# Patient Record
Sex: Female | Born: 1970 | Race: White | Hispanic: No | Marital: Single | Smoking: Current every day smoker
Health system: Southern US, Community
[De-identification: ages and names within clinical notes are randomized; demographics above are authoritative.]

## PROBLEM LIST (undated history)

## (undated) DIAGNOSIS — M25562 Pain in left knee: Secondary | ICD-10-CM

## (undated) DIAGNOSIS — G8929 Other chronic pain: Secondary | ICD-10-CM

## (undated) DIAGNOSIS — M5432 Sciatica, left side: Secondary | ICD-10-CM

## (undated) DIAGNOSIS — M9979 Connective tissue and disc stenosis of intervertebral foramina of abdomen and other regions: Secondary | ICD-10-CM

## (undated) DIAGNOSIS — M549 Dorsalgia, unspecified: Secondary | ICD-10-CM

## (undated) DIAGNOSIS — M25561 Pain in right knee: Secondary | ICD-10-CM

## (undated) DIAGNOSIS — C801 Malignant (primary) neoplasm, unspecified: Secondary | ICD-10-CM

## (undated) HISTORY — PX: PELVIC EXENTERATION: SHX738

## (undated) HISTORY — PX: BIOPSY BREAST: PRO8

## (undated) HISTORY — PX: ABDOMINAL HYSTERECTOMY: SHX81

---

## 1999-08-09 ENCOUNTER — Encounter: Admission: RE | Admit: 1999-08-09 | Discharge: 1999-09-16 | Payer: Self-pay | Admitting: Preventative Medicine

## 1999-09-29 ENCOUNTER — Ambulatory Visit (HOSPITAL_COMMUNITY): Admission: RE | Admit: 1999-09-29 | Discharge: 1999-09-29 | Payer: Self-pay | Admitting: Gastroenterology

## 2003-12-24 ENCOUNTER — Emergency Department (HOSPITAL_COMMUNITY): Admission: EM | Admit: 2003-12-24 | Discharge: 2003-12-24 | Payer: Self-pay | Admitting: Emergency Medicine

## 2003-12-26 ENCOUNTER — Other Ambulatory Visit: Admission: RE | Admit: 2003-12-26 | Discharge: 2003-12-26 | Payer: Self-pay | Admitting: Obstetrics and Gynecology

## 2004-03-12 ENCOUNTER — Encounter (INDEPENDENT_AMBULATORY_CARE_PROVIDER_SITE_OTHER): Payer: Self-pay | Admitting: Specialist

## 2004-03-13 ENCOUNTER — Inpatient Hospital Stay (HOSPITAL_COMMUNITY): Admission: AD | Admit: 2004-03-13 | Discharge: 2004-03-14 | Payer: Self-pay | Admitting: Obstetrics and Gynecology

## 2004-10-19 ENCOUNTER — Other Ambulatory Visit: Admission: RE | Admit: 2004-10-19 | Discharge: 2004-10-19 | Payer: Self-pay | Admitting: Gynecology

## 2004-11-19 ENCOUNTER — Encounter (INDEPENDENT_AMBULATORY_CARE_PROVIDER_SITE_OTHER): Payer: Self-pay | Admitting: *Deleted

## 2004-11-19 ENCOUNTER — Observation Stay (HOSPITAL_COMMUNITY): Admission: RE | Admit: 2004-11-19 | Discharge: 2004-11-20 | Payer: Self-pay | Admitting: Gynecology

## 2005-06-23 ENCOUNTER — Emergency Department: Payer: Self-pay | Admitting: Emergency Medicine

## 2006-07-04 ENCOUNTER — Emergency Department: Payer: Self-pay | Admitting: Emergency Medicine

## 2007-12-01 ENCOUNTER — Emergency Department (HOSPITAL_COMMUNITY): Admission: EM | Admit: 2007-12-01 | Discharge: 2007-12-01 | Payer: Self-pay | Admitting: Emergency Medicine

## 2008-05-29 ENCOUNTER — Emergency Department (HOSPITAL_COMMUNITY): Admission: EM | Admit: 2008-05-29 | Discharge: 2008-05-29 | Payer: Self-pay | Admitting: Emergency Medicine

## 2008-06-24 ENCOUNTER — Emergency Department (HOSPITAL_COMMUNITY): Admission: EM | Admit: 2008-06-24 | Discharge: 2008-06-24 | Payer: Self-pay | Admitting: Emergency Medicine

## 2008-07-12 ENCOUNTER — Emergency Department (HOSPITAL_COMMUNITY): Admission: EM | Admit: 2008-07-12 | Discharge: 2008-07-12 | Payer: Self-pay | Admitting: Emergency Medicine

## 2008-08-08 ENCOUNTER — Emergency Department (HOSPITAL_COMMUNITY): Admission: EM | Admit: 2008-08-08 | Discharge: 2008-08-08 | Payer: Self-pay | Admitting: Emergency Medicine

## 2008-08-11 ENCOUNTER — Emergency Department (HOSPITAL_COMMUNITY): Admission: EM | Admit: 2008-08-11 | Discharge: 2008-08-12 | Payer: Self-pay | Admitting: Emergency Medicine

## 2008-08-18 ENCOUNTER — Emergency Department: Payer: Self-pay | Admitting: Unknown Physician Specialty

## 2008-11-08 ENCOUNTER — Emergency Department (HOSPITAL_COMMUNITY): Admission: EM | Admit: 2008-11-08 | Discharge: 2008-11-08 | Payer: Self-pay | Admitting: Emergency Medicine

## 2009-01-17 ENCOUNTER — Emergency Department (HOSPITAL_COMMUNITY): Admission: EM | Admit: 2009-01-17 | Discharge: 2009-01-17 | Payer: Self-pay | Admitting: Emergency Medicine

## 2009-04-27 ENCOUNTER — Emergency Department (HOSPITAL_COMMUNITY): Admission: EM | Admit: 2009-04-27 | Discharge: 2009-04-28 | Payer: Self-pay | Admitting: Emergency Medicine

## 2009-09-07 ENCOUNTER — Emergency Department (HOSPITAL_COMMUNITY): Admission: EM | Admit: 2009-09-07 | Discharge: 2009-09-07 | Payer: Self-pay | Admitting: Emergency Medicine

## 2009-09-10 ENCOUNTER — Emergency Department (HOSPITAL_COMMUNITY): Admission: EM | Admit: 2009-09-10 | Discharge: 2009-09-10 | Payer: Self-pay | Admitting: Emergency Medicine

## 2009-09-22 ENCOUNTER — Emergency Department (HOSPITAL_COMMUNITY): Admission: EM | Admit: 2009-09-22 | Discharge: 2009-09-22 | Payer: Self-pay | Admitting: Emergency Medicine

## 2009-10-05 ENCOUNTER — Emergency Department (HOSPITAL_COMMUNITY): Admission: EM | Admit: 2009-10-05 | Discharge: 2009-10-05 | Payer: Self-pay | Admitting: Emergency Medicine

## 2009-10-06 ENCOUNTER — Emergency Department (HOSPITAL_COMMUNITY): Admission: EM | Admit: 2009-10-06 | Discharge: 2009-10-06 | Payer: Self-pay | Admitting: Emergency Medicine

## 2009-10-12 ENCOUNTER — Ambulatory Visit (HOSPITAL_COMMUNITY): Admission: RE | Admit: 2009-10-12 | Discharge: 2009-10-12 | Payer: Self-pay | Admitting: Emergency Medicine

## 2010-04-26 ENCOUNTER — Emergency Department (HOSPITAL_COMMUNITY): Admission: EM | Admit: 2010-04-26 | Discharge: 2010-04-27 | Payer: Self-pay | Admitting: Emergency Medicine

## 2011-01-16 LAB — CBC
HCT: 41.8 % (ref 36.0–46.0)
MCH: 32.4 pg (ref 26.0–34.0)
MCHC: 34.7 g/dL (ref 30.0–36.0)
Platelets: 279 10*3/uL (ref 150–400)
RBC: 4.47 MIL/uL (ref 3.87–5.11)
WBC: 8.9 10*3/uL (ref 4.0–10.5)

## 2011-01-16 LAB — BASIC METABOLIC PANEL
CO2: 30 mEq/L (ref 19–32)
Calcium: 10 mg/dL (ref 8.4–10.5)
Creatinine, Ser: 0.74 mg/dL (ref 0.4–1.2)
GFR calc Af Amer: >60 mL/min (ref >60)
Potassium: 4.4 mEq/L (ref 3.5–5.1)

## 2011-01-16 LAB — HEPATIC FUNCTION PANEL
ALT: 15 U/L (ref 0–35)
Bilirubin, Direct: <0.1 mg/dL (ref 0.0–0.3)

## 2011-01-16 LAB — URINALYSIS, ROUTINE W REFLEX MICROSCOPIC
Bilirubin Urine: NEGATIVE
Glucose, UA: NEGATIVE mg/dL
Hgb urine dipstick: NEGATIVE
Protein, ur: NEGATIVE mg/dL
Specific Gravity, Urine: 1.019 (ref 1.005–1.030)
Urobilinogen, UA: 0.2 mg/dL (ref 0.0–1.0)
pH: 5.5 (ref 5.0–8.0)

## 2011-01-16 LAB — DIFFERENTIAL
Basophils Absolute: 0.1 10*3/uL (ref 0.0–0.1)
Eosinophils Absolute: 0.3 10*3/uL (ref 0.0–0.7)
Eosinophils Relative: 3 % (ref 0–5)
Lymphs Abs: 3 10*3/uL (ref 0.7–4.0)
Neutrophils Relative %: 56 % (ref 43–77)

## 2011-02-07 LAB — URINALYSIS, ROUTINE W REFLEX MICROSCOPIC
Ketones, ur: NEGATIVE mg/dL
Nitrite: NEGATIVE
Protein, ur: NEGATIVE mg/dL
Specific Gravity, Urine: 1.009 (ref 1.005–1.030)
Urobilinogen, UA: 0.2 mg/dL (ref 0.0–1.0)

## 2011-02-07 LAB — URINE MICROSCOPIC-ADD ON

## 2011-02-07 LAB — URINE CULTURE

## 2011-02-14 LAB — DIFFERENTIAL
Eosinophils Absolute: 0 10*3/uL (ref 0.0–0.7)
Eosinophils Relative: 1 % (ref 0–5)
Lymphs Abs: 2.1 10*3/uL (ref 0.7–4.0)
Monocytes Absolute: 0.1 10*3/uL (ref 0.1–1.0)
Neutro Abs: 2 10*3/uL (ref 1.7–7.7)

## 2011-02-14 LAB — POCT I-STAT, CHEM 8
BUN: 16 mg/dL (ref 6–23)
Calcium, Ion: 1.18 mmol/L (ref 1.12–1.32)
Creatinine, Ser: 0.8 mg/dL (ref 0.4–1.2)
Potassium: 5.2 mEq/L — ABNORMAL HIGH (ref 3.5–5.1)
Sodium: 140 mEq/L (ref 135–145)

## 2011-02-14 LAB — URINALYSIS, ROUTINE W REFLEX MICROSCOPIC: Glucose, UA: NEGATIVE mg/dL

## 2011-02-14 LAB — CBC
MCHC: 33.3 g/dL (ref 30.0–36.0)
RDW: 13.2 % (ref 11.5–15.5)

## 2011-03-18 NOTE — Op Note (Signed)
[name], [name]                          ACCOUNT NO.:  [id number]   MEDICAL RECORD NO.:  [id number]                   PATIENT TYPE:  OBV   LOCATION:  9313                                 FACILITY:  [facility]   PHYSICIAN:  James A. Ashley Royalty, M.D.             DATE OF BIRTH:  [birth year]   DATE OF PROCEDURE:  03/12/2004  DATE OF DISCHARGE:                                 OPERATIVE REPORT   PREOPERATIVE DIAGNOSES:  1. Abnormal uterine bleeding--rule out polyp, fibroid.  2. Pelvic pain--debilitating.   POSTOPERATIVE DIAGNOSES:  1. Abnormal uterine bleeding--rule out polyp, fibroid.  No evidence of     intrauterine polyp or fibroid.  2. Right ovarian cyst. No evidence of endometriosis.   PROCEDURE:  1. Diagnostic/operative laparoscopy.  2. Right oophorectomy.  3. Partial right salpingectomy (distal).  4. Diagnostic hysteroscopy, dilatation and curettage.   SURGEON:  Rudy Jew. Ashley Royalty, M.D.   ANESTHESIA:  General endotracheal.   ESTIMATED BLOOD LOSS:  100 mL.   COMPLICATIONS:  None.   PACKS AND DRAINS:  None.   DESCRIPTION OF PROCEDURE:  The patient was taken to the operating room,  placed in the dorsal supine position where after adequate general  endotracheal anesthesia was administered she was placed in the lithotomy  position and prepped and draped in the usual manner for abdominal and  vaginal therapy. A posterior weighted retractor was placed per vagina.  The  anterior lip of the cervix was grasped with a single tooth tenaculum. The  uterus was gently sounded to approximately 8 cm and noted to be anteverted.  The cervix was then dilated to a size 29 using Pratt dilators. The  resectoscope was then placed using sorbitol as its distention medium. The  thing over the cervix made it difficult to place the resectoscope entirely  in the uterine cavity with only a 29 French dilatation.  His dilatation was  increased to approximately 21 [place] with favorable results thereafter.  The  resectoscope was successfully placed using sorbitol as a distention medium.  There were some bubbles noted in the uterine cavity which prevented the  bilateral. Identification of the tubal ostia.  However, there were no  obvious structural abnormalities within the uterine cavity such as polyps or  fibroids.  The endocervix was similarly benign.  Appropriate photos were  obtained. At this point, the hysteroscopic portion of the procedure was  terminated.   Attention was then turned to the curettage.  A medium size curette was  introduced into the uterine cavity. First a four quadrant technique was  employed and inspected with curettage. Then the therapeutic technique was  used. A moderate to large amount of curettings were obtained for histologic  studies.  Hemostasis was noted.  Next, the Charcot uterine manipulator was  placed per cervix and held in place with the tenaculum. The bladder was  drained again with a red rubber catheter. Attention was then turned to  the  laparoscopy.   A 1.2 cm infraumbilical incision was made in the transverse plane in keeping  with the patient's old laparoscopy incision.  A size 10/11 disposable  laparoscopic trocar was then placed in the abdominal cavity.  Its location  was verified by placement of the laparoscope. There was no evidence of any  trauma. The abdominal cavity was then distended with CO2 and created a good  pneumoperitoneum. This was maintained throughout the procedure. Next, 5 mm  suprapubic trocars were placed in the left and right lower quadrants  respectively. Transillumination and direct visualization techniques were  employed.  The pelvis was then thoroughly surveyed. The uterus was normal  size, shape and contour without evidence of any fibroids or endometriosis.  The left ovary was normal size, shape and contour. It was slightly adherent  to the left pelvic sidewall posteriorly.  However sufficient manipulation  revealed that  the anatomy of the ovary itself was perfectly normal. The left  fallopian tube had been divided in keep with the patient's history of the  tubal sterilization procedure.  There were no other abnormalities of the  left fallopian tube.  The right fallopian tube was similarly divided in  keeping with the patient's history of a tubal sterilization procedure. The  right ovary was enlarged with an approximately 2 cm cyst present on it. It  was also adherent to the right pelvic sidewall.  The anterior and posterior  cul-de-sacs were clean. The remainder of the peritoneal surfaces were smooth  and glistening. There was no evidence of any endometriosis or other pelvic  adhesions. Appropriate photos were obtained.   Due to the patient's prominent right lower quadrant discomfort and because  of the presence of the ovary and cyst, a decision was made to proceed with  right oophorectomy. The ureter was identified well below the plane of  dissection. The right infundibulopelvic ligament was grasped with a tripolar  cautery and cauterized. It was then divided.  The mesovarian was then  cauterized and cut with the tripolar cautery.  The mesosalpinx of the distal  fallopian tube segment was also cauterized in the distal portion of  fallopian tube and ovary were removed in their entirety and submitted to the  pathologist for histologic studies.  The operator encountered a moderate  amount of bleeding from the right mesosalpinx which was controlled with the  tripolar cautery near but not involving the right pelvic sidewall.  The  right ureter was again noted to be well below the plane of the dissection.  Copious irrigation was accomplished. Hemostasis was noted.  Next, the left  inferior trocar site was converted to a 10 mm diameter. The EndoCatch was  placed through this port and the surgical specimen consisting of the right  ovary and distal segment of the right fallopian tube was brought into the bag and  removed through the left inferior incision successfully. It was  submitted to pathology for histologic studies.   At this point, the entire surgical field was thoroughly inspected. The area  of the right adnexectomy was thoroughly inspected as well. The  intraabdominal pressure was intentional diminished and hemostasis continued  to be good.  Copious irrigation was accomplished.  Hemostasis remained good  throughout.   At this point, the patient was felt to have benefitted maximally from the  surgical procedure. The abdominal instruments were removed and  pneumoperitoneum evacuated. The fascial defects were closed with #0 Vicryl  in an interrupted fashion. The skin was closed  with 3-0 Monocryl in a  subcuticular fashion.  The vaginal instruments were removed. A small bleeder  in the left tenaculum site on the cervix was oozing and easily controlled  with 2-0 chromic in an interrupted fashion. Hemostasis was noted and the  procedure terminated.   The patient tolerated the procedure extremely well and was returned to the  recovery room. Due to the slightly increased amount of bleeding in the right  mesosalpinx with the excision of the right ovary and portion of fallopian  tube, the decision was made to observe the patient overnight using a 23 hour  observation format and discharge her home in the morning after CBC  determination if all is stable.                                               James A. Ashley Royalty, M.D.    JAM/MEDQ  D:  03/12/2004  T:  03/13/2004  Job:  [id number]

## 2011-03-18 NOTE — Discharge Summary (Signed)
[name], [name]                          ACCOUNT NO.:  [id number]   MEDICAL RECORD NO.:  [id number]                   PATIENT TYPE:  INP   LOCATION:  9313                                 [facility]ACILITY[facility]   PHYSICIAN:  James A. Ashley Royalty, M.D.             DATE OF BIRTH:  02-Mar-1971   DATE OF ADMISSION:  03/12/2004  DATE OF DISCHARGE:  03/14/2004                                 DISCHARGE SUMMARY   DISCHARGE DIAGNOSES:  1. Hemorrhagic corpus luteum cyst of the right ovary.  2. Benign fallopian tube.  3. Benign secretory endometrium without evidence of hyperplasia.   OPERATIONS AND SPECIAL PROCEDURES:  1. Diagnostic hysteroscopy.  2. Dilatation and curettage.  3. Diagnostic/operative laparoscopy.  4. Right oophorectomy.  5. Partial right salpingectomy (distal).   CONSULTATIONS:  None.   DISCHARGE MEDICATIONS:  Chromagen, Percocet.   HISTORY AND PHYSICAL:  This is a 40 year old gravida 3 para 3 with multiple  concerns including pelvic pain and abnormal uterine bleeding.  For details  please see History and Physical.  She was brought in for  diagnostic/operative hysteroscopy, D&C, and diagnostic/operative  laparoscopy.  For the remainder of the history and physical please see  chart.   HOSPITAL COURSE:  The patient was brought in for outpatient surgery on Mar 12, 2004.  She underwent diagnostic hysteroscopy, dilatation and curettage,  diagnostic/operative laparoscopy, with right oophorectomy and partial right  salpingectomy (distal).  The patient was kept postoperatively for further  evaluation and therapy.  On Mar 14, 2004 she was felt stable for discharge  and was discharged to home per Dr. Lily Peer afebrile and in satisfactory  condition.   ACCESSORY CLINICAL FINDINGS:  Hemoglobin and hematocrit on admission were  9.4 and 30.2 respectively.  Repeat values obtained Mar 13, 2004 were 8.7 and  27.4 respectively.   DISPOSITION:  The patient is to return to [facility]  in approximately 2 weeks for further evaluation and therapy.                                               James A. Ashley Royalty, M.D.    JAM/MEDQ  D:  03/31/2004  T:  04/01/2004  Job:  [id number]

## 2011-03-18 NOTE — Discharge Summary (Signed)
[name], [name]              ACCOUNT NO.:  [id number]   MEDICAL RECORD NO.:  [id number]          PATIENT TYPE:  INP   LOCATION:  9312                          [facility]   PHYSICIAN:  Juan H. Lily Peer, M.D.DATE OF BIRTH:  1971/03/17   DATE OF ADMISSION:  11/19/2004  DATE OF DISCHARGE:  11/20/2004                                 DISCHARGE SUMMARY   HISTORY:  The patient is a 40 year old, gravida 4, para 3, AB 1, with prior  tubal sterilization procedure, was taken to the operating room, on November 20, 2003, secondary to chronic pelvic pain, dysmenorrhea, menorrhagia, and  iron deficiency anemia.  She underwent a laparoscopic assisted vaginal  hysterectomy, had a blood loss of 225 cc, IV fluids 2300 cc.  The patient's  preoperative hemoglobin had been 10.7, postoperative hemoglobin was 8,  platelet count 307,000.  The patient received Mefoxin 1 gram IV  prophylactically before her surgery.  The patient postoperatively was  gradually ambulating and had an advance in her diet from a clear liquid to a  full regular diet.  She had ambulated throughout the hospital several times  the day of her surgery and gone downstairs to smoke on several occasions.  She had passed gas.  Her abdomen was somewhat tender but no rebound or  guarding, good bowel sounds, and she had passed flatus earlier in the day.  The patient will be discharged home and follow up in the office in two  weeks.   FINAL DIAGNOSES:  1.  Chronic pelvic pain.  2.  Dysmenorrhea.  3.  Menorrhagia.  4.  Iron deficiency anemia.   PROCEDURES PERFORMED:  Laparoscopic assisted vaginal hysterectomy.   FINAL DISPOSITION AND FOLLOW UP:  1.  The patient was discharged home a day and a half after her surgery.  2.  She will be discharged with:      1.  Darvocet-N 100 to take 1-2 p.o. q.3-4h. p.r.n.      2.  Motrin 800 mg t.i.d.  3.  The patient was instructed to ambulate.  4.  No lifting, no strenuous activity.  5.  Shower  bath, no tub bath.  6.  The patient should continue to take her multivitamin with iron, since      she can not take ferrous sulfate by itself, in an effort to get her iron      level up.  7.  She remained afebrile and was normotensive, and was ready to be      discharged.  8.  Instructions were provided and she had a Dulcolax suppository before she      left the hospital, and she was instructed to take Metamucil 1 tablespoon      with her juice every morning and in the evening a Colace stool softener      and to keep up with her fluids.  Her husband was present during the      discharge instructions.  All questions were answered.  Will follow      accordingly.    Juan  JHF/MEDQ  D:  11/20/2004  T:  11/20/2004  Job:  [id number]

## 2011-03-18 NOTE — H&P (Signed)
[name], [name]                          ACCOUNT NO.:  [id number]   MEDICAL RECORD NO.:  [id number]                   PATIENT TYPE:  AMB   LOCATION:  SDC                                  [facility]ACILITY:  [facility]   PHYSICIAN:  James A. Ashley Royalty, M.D.             DATE OF BIRTH:  22-Dec-1970   DATE OF ADMISSION:  03/12/2004  DATE OF DISCHARGE:                                HISTORY & PHYSICAL   This is a 40 year old gravida 3, para 3, who presented to me December 26, 2003, with multiple concerns including pelvic pain and abnormal uterine  bleeding.  She described the pelvic pain as equal bilaterally, including  radiation to the hips and legs.  She described the abnormal bleeding as  occurring every two weeks, lasting six days at a time.  She has been seeing  a physician in [place] for an extended period of time and has undergone  conization of the cervix, diagnostic laparoscopy in 1994 at which time she  states endometriosis was found.  She has also had a bilateral tubal  sterilization procedure.  She came for sonohysterogram January 20, 2004, at  which time the ultrasound was normal and there were no intracavitary lesions  noted at sonohysterogram.  She is for diagnostic/operative laparoscopy as  well as diagnostic/operative hysteroscopy and dilatation and curettage.   MEDICATIONS:  None.   PAST MEDICAL HISTORY:  Medical:  Asthma.   Surgical:  As above, plus removal of breast lump.   ALLERGIES:  CODEINE.   FAMILY HISTORY:  Positive for hypertension, diabetes, and breast cancer.   SOCIAL HISTORY:  The patient smokes approximately one-half packs of  cigarettes per day.  She uses alcohol rarely.   REVIEW OF SYSTEMS:  Noncontributory.   PHYSICAL EXAMINATION:  GENERAL:  A well-developed, well-nourished, pleasant  white female in no acute distress.  VITAL SIGNS:  Afebrile, vital signs stable.  SKIN:  Warm and dry without lesions.  LYMPHATIC:  There is no supraclavicular, cervical, or  inguinal adenopathy.  HEENT:  Normocephalic.  NECK:  Supple without thyromegaly.  CHEST:  Lungs are clear.  CARDIAC:  Regular rate and rhythm without murmurs, gallops, or rubs.  BREASTS:  Exam deferred.  ABDOMEN:  Soft, nontender, without masses or organomegaly.  Bowel sounds are  active.  MUSCULOSKELETAL:  Full range of motion without edema, cyanosis, or CVA  tenderness.  PELVIC (Mar 05, 2004):  External genitalia within normal limits.  Vagina and  cervix were without gross lesions.  Bimanual examination reveals the uterus  to be approximately 8 x 4 x 4 cm, and no adnexal masses are palpable.   IMPRESSION:  1. Pelvic pain, debilitating.  Differential includes endometriosis,     adhesions, primary gastrointestinal, musculoskeletal.  2. Abnormal uterine bleeding, etiology uncertain.   PLAN:  1. Diagnostic/operative laparoscopy.  2. Diagnostic/operative hysteroscopy.  3. Dilatation and curettage.   The risks, benefits, complications, and alternatives were fully  discussed  with the patient.  The possibility of unilateral salpingo-oophorectomy  discussed and accepted.  The possibility of exploratory laparotomy discussed  and accepted.  Questions invited and answered.                                               James A. Ashley Royalty, M.D.    JAM/MEDQ  D:  03/12/2004  T:  03/12/2004  Job:  [id number]

## 2011-03-18 NOTE — Op Note (Signed)
NAMEJAKAYLEE, SASAKI              ACCOUNT NO.:  [id number]   MEDICAL RECORD NO.:  [id number]          PATIENT TYPE:  INP   LOCATION:  9312                          [facility]   PHYSICIAN:  Juan H. Lily Peer, M.D.DATE OF BIRTH:  1971/02/13   DATE OF PROCEDURE:  11/19/2004  DATE OF DISCHARGE:                                 OPERATIVE REPORT   PREOPERATIVE DIAGNOSES:  1.  Chronic pelvic pain.  2.  Dysmenorrhea.  3.  Menorrhagia.  4.  Iron-deficiency anemia.   POSTOPERATIVE DIAGNOSES:  1.  Chronic pelvic pain.  2.  Dysmenorrhea.  3.  Menorrhagia.  4.  Iron-deficiency anemia.   ANESTHESIA:  General endotracheal anesthesia.   PROCEDURE PERFORMED:  Laparoscopically-assisted vaginal hysterectomy.   INDICATION FOR OPERATION:  A 40 year old gravida 4, para 3, AB 1, with  chronic pelvic pain, dysmenorrhea, menorrhagia, and iron-deficiency anemia.  Patient with previous laparoscopy for chronic pelvic pain and tubal  sterilization procedure.  Patient also with prior history of right salpingo-  oophorectomy.   SURGEON:  Juan H. Lily Peer, M.D.   FIRST ASSISTANT:  Daniel L. Eda Paschal, M.D.   FINDINGS:  The patient on exam under anesthesia with an anteverted uterus,  normal size, shape and consistency.  Adnexa:  No mass or tenderness.  Laparoscopic evaluation did not demonstrate any abnormality on the uterine  surface.  Evidence of bilateral tubal transection was noted.  The absence of  right ovary was noted.  Left ovary was otherwise normal.  Anterior and  posterior cul-de-sac free of adhesions or endometriotic implant.  Appendix  not visualized.   DESCRIPTION OF OPERATION:  After the patient was adequately counseled, she  was taken to the operating room, where she underwent a successful general  endotracheal anesthesia.  The abdomen was prepped and draped in the usual  sterile fashion.  A Foley catheter had been inserted in an effort to monitor  urinary output.  A Hulka  tenaculum was placed on the cervix/uterus for  manipulation during the laparoscopic procedure.  A small semilunar incision  was made under the umbilicus and then direct visual entrance into the  peritoneal cavity was done with a blunt dissection.  The 10 mm trocar was  inserted.  Two additional 5 mm trocars were inserted approximately four  fingerbreadths from the midline and 2 cm above the symphysis region and  under laparoscopic guidance.  Systematic inspection of the pelvic cavity  with findings as described above.  Attention was placed on the right round  ligament, which was cauterized and transected.  The bladder flap was  established with the Endoshears.  The cardinal and broad ligament were  cauterized and transected with the cautery transection tripolar unit to the  level of the uterine artery.  Attention was then placed to the left adnexa,  and the left round ligament was grasped, cauterized and transected.  Also,  the left utero-ovarian ligament was clamped, cauterized and transected and  the bladder flap had been established as well, and the remaining cardinal  and broad ligaments were clamped, cauterized and transected with the  tripolar unit to the level  of the uterine artery.  Attention was then  focused on the transvaginal portion.  The patient's leg was placed in the  high lithotomy position.  The short weighted billed speculum was inserted  into the vaginal vault and with two retractors for exposure, the cervix was  grasped with two Lahey clamps.  Lidocaine 2% with 1:100,000 epinephrine was  infiltrated into the cervicovaginal stroma at the 2, 4, 8 and 10 o'clock  position.  A circumferential incision was made with a scalpel at the  cervicovaginal fold.  The posterior colpotomy was established.  Both  uterosacral ligament were clamped, cut, and suture ligated with 0 Vicryl  suture.  Anterior colpotomy was accomplished.  The remaining broad and  cardinal ligaments on each  side were serially clamped, cut, and suture  ligated with 0 Vicryl suture and both uterine arteries were doubly secured  with 0 Vicryl suture.  The cervix and uterus were passed off the operative  field.  There was some oozing at both the cuff at the 4 and 8 o'clock  position, which were individually ligated, and then the posterior cuff was  secured with a running locking stitch of 1-0 Vicryl suture.  Following this,  the vaginal cuff was closed with interrupted sutures of 0 Vicryl suture in  figure-of-eight fashion and the vaginal area was irrigated with normal  saline solution.  Then we proceeded with looking laparoscopically, the legs  were brought down, the pneumoperitoneum was re-established once again, and  the area where the hysterectomy had been accomplished was inspected.  There  was isolated oozing, which were individually cauterized, and a strip of  Interceed was placed over the vaginal cuff area.  Of note, prior to doing  this the pelvic cavity was flooded with saline solution to see if there was  any additional bleeder and none were noted, and then the Interceed was  placed after the removal of the flood, and the pneumoperitoneum was removed,  the instruments were removed.  The subumbilical fascia was closed with a  single figure-of-eight of 0 Vicryl suture, and the skin was reapproximated  with a subcuticular stitch of 4-0 plain catgut suture.  In both 5 mm port  sites, only the skin was reapproximated with two interrupted sutures of 4-0  plain catgut suture.  For postoperative analgesia, 9 mL of 0.25% Marcaine  was infiltrated in all three incision sites for postoperative analgesia.  The patient was extubated, transferred to the recovery room with stable  vital signs, and did receive 1 g of Cefotan prophylactically.  Her IV fluids  were 2300 mL of lactated Ringer's, urine output was 225 mL.  Blood loss was  400 mL.    Juan   JHF/MEDQ  D:  11/19/2004  T:  11/19/2004  Job:   [id number]

## 2011-03-18 NOTE — H&P (Signed)
NAMEKENNETTE, [name]              ACCOUNT NO.:  [id number]   MEDICAL RECORD NO.:  [id number]          PATIENT TYPE:  AMB   LOCATION:  [facility]C                           FACILITY:  [facility]   PHYSICIAN:  Juan H. Lily Peer, M.D.DATE OF BIRTH:  July 02, 1971   DATE OF ADMISSION:  11/19/2004  DATE OF DISCHARGE:                                HISTORY & PHYSICAL   CHIEF COMPLAINT:  Chronic pelvic pain and dysmenorrhea and menorrhagia.   HISTORY:  The patient is a 40 year old gravida 4, para 3, AB 1, with  previous tubal sterilization procedure.  Was seen in the office as a new  patient at [facility] on December 20.  The patient was  complaining of worsening dysmenorrhea, menorrhagia.  She has seen another  practitioner in the community and in May of this year she had undergone a  hysteroscopy and D&C and laparoscopy with right oophorectomy secondary to  her pain with benign findings on pathology report.  The patient has had one  prior laparoscopy for chronic pelvic pain and then another laparoscopy for  tubal sterilization.  The patient has been at wit's end with the amount of  bleeding that she is having, and she has suffered from iron-deficiency  anemia.  They have attempted by prior practitioners to place on different  hormone manipulative therapies without success.  She cannot tolerate oral  contraceptives or iron tablets.  She had an ultrasound on January 13, which  demonstrated a uterus measuring 10.4 x 4.6 x 5.5 cm with an endometrial  stripe of 14.8 mm.  Several hyperechoic areas were noted within the cavity,  questionable polyps or hyperplasia.  She had a surgically absent right  adnexa.  The left ovary was normal, measuring 3.8 x 2.2 x 2.5 cm.  Endometrial biopsy done on December 20:  Benign secretory endometrium, no  hyperplasia identified, and her Pap smear done on December 20 was benign.  The patient would like to proceed for definitive surgery, for which she is  scheduled for  a laparoscopic-assisted hysterectomy with ovarian  conservation.   PAST MEDICAL HISTORY:  The patient has had three pregnancies, two normal  spontaneous vaginal deliveries.   She is allergic to LORCET, MEGACE, THORAZINE and IMIPRAMINE.   She is currently on One-A-Day multivitamins twice a day.  She takes  alprazolam for headaches.  She has taken lidocaine patch as well in the past  for her migraine headache.   FAMILY HISTORY:  Mother, father, grandmother with history of diabetes.  Mother and father with history of hypertension.  Parents and grandparents  with a history of cardiovascular disease and grandmother with breast cancer.   PHYSICAL EXAMINATION:  GENERAL:  The patient is a thin 128-pound Caucasian.  VITAL SIGNS:  Blood pressure of 110/80.  HEENT:  Unremarkable.  NECK:  Supple, trachea midline, no carotid bruits, no thyromegaly.  CHEST:  Lungs were clear to auscultation without rhonchi or wheezes.  CARDIAC:  Regular rate and rhythm, no murmurs or gallop.  BREASTS:  Exam not done.  ABDOMEN:  Soft, nontender, without rebound or guarding.  PELVIC:  Bartholin's, urethra, Skene's  glands within normal limits.  Vagina  and cervix:  No gross lesions on inspection.  Uterus anteverted, no palpable  masses, no rebound or guarding.  RECTAL:  Exam not done.   ASSESSMENT:  A 40 year old gravida 3, para 3, with prior history of tubal  sterilization procedure, prior history of a laparoscopy for chronic pelvic  pain, patient with devastating chronic dysmenorrhea, menorrhagia, iron-  deficiency anemia, recent hemoglobin was at 10.  The patient cannot tolerate  different forms of hormone therapy that have been tried in an effort to  regulate her cycles or oral contraceptives or iron tablets.  The patient may  have underlying endometriosis and/or adhesions.  This is why the  laparoscopically-assisted vaginal hysterectomy approach was discussed and  recommended.  In the event that the  procedure would not be able to finish  laparoscopy, will need to do an open laparotomy to gain access to the pelvic  cavity and complete the operation.  If not, the patient is also aware of  potential risks including infection, although she will receive prophylaxis  antibiotics, the risk for deep venous thrombosis and a pulmonary embolism.  Prophylactically she will receive PSA stockings.  Also, as a result of  hemorrhage in the event that she would need blood or blood products, she is  fully aware of the potential risk of anaphylactic reaction, hepatitis and  AIDS, also potential risk for trauma to internal organs and with corrective  surgery at that time were discussed with the patient, and she is also fully  aware that after such operation, she may continue to have chronic pelvic  pain and may need further evaluation such as genitourinary or  gastrointestinal.  Since she is 40 years of age, all efforts will be taken  into account to leave her remaining ovary unless there is severe  endometriosis or potential for a future problem is identified.  The patient  has given full authority to remove the remaining ovary.  The patient has had  a previous sterilization procedure, so she knows definitely that she will  not be able to have any more children once the hysterectomy is completed.  She and her husband agree with all of the above, all questions are answered,  and will follow accordingly.   PLAN:  The patient is scheduled for a laparoscopically-assisted vaginal  hysterectomy on Friday, January 20, at 7:30 a.m. at [facility]  Please have history and physical available.     Juan   JHF/MEDQ  D:  11/18/2004  T:  11/18/2004  Job:  [phone]

## 2011-05-26 ENCOUNTER — Emergency Department (HOSPITAL_COMMUNITY)
Admission: EM | Admit: 2011-05-26 | Discharge: 2011-05-26 | Disposition: A | Discharge status: Home / Self Care | Payer: Self-pay | Attending: Emergency Medicine | Admitting: Emergency Medicine

## 2011-05-26 DIAGNOSIS — S1096XA Insect bite of unspecified part of neck, initial encounter: Secondary | ICD-10-CM | POA: Insufficient documentation

## 2011-05-26 DIAGNOSIS — M542 Cervicalgia: Secondary | ICD-10-CM | POA: Insufficient documentation

## 2011-05-26 DIAGNOSIS — W57XXXA Bitten or stung by nonvenomous insect and other nonvenomous arthropods, initial encounter: Secondary | ICD-10-CM | POA: Insufficient documentation

## 2011-05-26 DIAGNOSIS — J45909 Unspecified asthma, uncomplicated: Secondary | ICD-10-CM | POA: Insufficient documentation

## 2011-05-26 DIAGNOSIS — R599 Enlarged lymph nodes, unspecified: Secondary | ICD-10-CM | POA: Insufficient documentation

## 2011-05-26 DIAGNOSIS — R5381 Other malaise: Secondary | ICD-10-CM | POA: Insufficient documentation

## 2011-05-26 LAB — MONONUCLEOSIS SCREEN: Mono Screen: NEGATIVE

## 2011-08-23 ENCOUNTER — Emergency Department (HOSPITAL_COMMUNITY)
Admission: EM | Admit: 2011-08-23 | Discharge: 2011-08-23 | Disposition: A | Discharge status: Home / Self Care | Payer: Self-pay | Attending: Emergency Medicine | Admitting: Emergency Medicine

## 2011-08-23 DIAGNOSIS — F17200 Nicotine dependence, unspecified, uncomplicated: Secondary | ICD-10-CM | POA: Insufficient documentation

## 2011-08-23 DIAGNOSIS — M549 Dorsalgia, unspecified: Secondary | ICD-10-CM | POA: Insufficient documentation

## 2011-08-23 DIAGNOSIS — R112 Nausea with vomiting, unspecified: Secondary | ICD-10-CM | POA: Insufficient documentation

## 2011-08-23 DIAGNOSIS — H53149 Visual discomfort, unspecified: Secondary | ICD-10-CM | POA: Insufficient documentation

## 2011-08-23 DIAGNOSIS — M5430 Sciatica, unspecified side: Secondary | ICD-10-CM | POA: Insufficient documentation

## 2011-08-23 DIAGNOSIS — G43909 Migraine, unspecified, not intractable, without status migrainosus: Secondary | ICD-10-CM | POA: Insufficient documentation

## 2011-08-23 DIAGNOSIS — J45909 Unspecified asthma, uncomplicated: Secondary | ICD-10-CM | POA: Insufficient documentation

## 2011-10-01 ENCOUNTER — Emergency Department (HOSPITAL_COMMUNITY)
Admission: EM | Admit: 2011-10-01 | Discharge: 2011-10-01 | Disposition: A | Discharge status: Home / Self Care | Payer: Self-pay | Attending: Emergency Medicine | Admitting: Emergency Medicine

## 2011-10-01 ENCOUNTER — Encounter: Payer: Self-pay | Admitting: *Deleted

## 2011-10-01 DIAGNOSIS — F17200 Nicotine dependence, unspecified, uncomplicated: Secondary | ICD-10-CM | POA: Insufficient documentation

## 2011-10-01 DIAGNOSIS — H53149 Visual discomfort, unspecified: Secondary | ICD-10-CM | POA: Insufficient documentation

## 2011-10-01 DIAGNOSIS — R110 Nausea: Secondary | ICD-10-CM | POA: Insufficient documentation

## 2011-10-01 DIAGNOSIS — R51 Headache: Secondary | ICD-10-CM | POA: Insufficient documentation

## 2011-10-01 HISTORY — DX: Pain in right knee: M25.561

## 2011-10-01 HISTORY — DX: Pain in left knee: M25.562

## 2011-10-01 MED ORDER — SODIUM CHLORIDE 0.9 % IV BOLUS (SEPSIS)
1000.0000 mL | Freq: Once | INTRAVENOUS | Status: AC
  Administered 2011-10-01: 1000 mL via INTRAVENOUS

## 2011-10-01 MED ORDER — HALOPERIDOL LACTATE 5 MG/ML IJ SOLN
2.0000 mg | Freq: Once | INTRAMUSCULAR | Status: AC
  Administered 2011-10-01: 2 mg via INTRAVENOUS
  Filled 2011-10-01: qty 1

## 2011-10-01 MED ORDER — DROPERIDOL 2.5 MG/ML IJ SOLN: 1.2500 mg | Freq: Once | INTRAMUSCULAR | Status: DC

## 2011-10-01 NOTE — ED Notes (Signed)
PT c/o migraine x 2 weeks. Pt seen by PCP today, given medications, stated migraine no better.

## 2011-10-01 NOTE — ED Provider Notes (Signed)
History     CSN: [id number] Arrival date & time: 10/01/2011  5:06 AM   First MD Initiated Contact with Patient 10/01/11 [phone]      Chief Complaint  Patient presents with  . Migraine    x 4 weeks    (Consider location/radiation/quality/duration/timing/severity/associated sxs/prior treatment) HPI The patient presents with headache. She notes that for the past month she's had intermittent headaches, now for the past 2 weeks has had a nearly persistent headache. The headache is global with focal points posterior ophthalmic area. The pain is described as a throbbing sensation. No relief with Axert or Xanax. The patient saw her primary care Dr. earlier today and is in the process of starting a new medication. She also complains of nausea, photophobia, diffuse discomfort. No fever, no chills, no diarrhea, no dysuria, no disorientation, no ataxia. Past Medical History  Diagnosis Date  . Migraine   . Knee pain, bilateral   . Asthma     Past Surgical History  Procedure Date  . Abdominal hysterectomy   . Biopsy breast   . Pelvic exenteration     History reviewed. No pertinent family history.  History  Substance Use Topics  . Smoking status: Current Everyday Smoker -- 0.5 packs/day    Types: Cigarettes  . Smokeless tobacco: Not on file  . Alcohol Use: No    OB History    Grav Para Term Preterm Abortions TAB SAB Ect Mult Living                  Review of Systems  All other systems reviewed and are negative.    Allergies  Morphine and related; Prednisone; Seroquel; and Thorazine  Home Medications   Current Outpatient Rx  Name Route Sig Dispense Refill  . ALMOTRIPTAN MALATE 12.5 MG PO TABS Oral Take 12.5 mg by mouth as needed. may repeat in 2 hours if needed     . ALPRAZOLAM 1 MG PO TABS Oral Take 1 mg by mouth at bedtime as needed. Relief drug for axert    . KETOROLAC TROMETHAMINE 30 MG/ML IJ SOLN Intravenous Inject 60 mg into the vein once.      . MELOXICAM 7.5 MG PO  TABS Oral Take 7.5 mg by mouth 2 (two) times daily.     Marland Kitchen NORTRIPTYLINE HCL 25 MG PO CAPS Oral Take 25 mg by mouth at bedtime.     . OXYCODONE-ACETAMINOPHEN 7.5-500 MG PO TABS Oral Take 1 tablet by mouth every 4 (four) hours as needed. Pain      BP 104/75  Pulse 78  Temp(Src) 98.3 F (36.8 C) (Oral)  Resp 18  SpO2 99%  Physical Exam  Nursing note and vitals reviewed. Constitutional: She appears well-developed and well-nourished. No distress.  HENT:  Head: Normocephalic and atraumatic.  Eyes: Conjunctivae and EOM are normal. Pupils are equal, round, and reactive to light.  Neck: No JVD present.  Cardiovascular: Normal rate and regular rhythm.   Pulmonary/Chest: Effort normal and breath sounds normal. No stridor. No respiratory distress.  Abdominal: Soft. She exhibits no distension.  Musculoskeletal: She exhibits no edema.  Neurological: She is alert. No cranial nerve deficit. She exhibits normal muscle tone. Coordination normal.  Skin: Skin is warm and dry.  Psychiatric: She has a normal mood and affect. Her behavior is normal.    ED Course  Procedures (including critical care time)  Labs Reviewed - No data to display No results found.   No diagnosis found.   7:23 AM Notes  that her headache is significantly improved and would like a home. MDM  This 40 year old female with history of migraine headaches now presents with 2 weeks of nearly persistent headache. On exam the patient is uncomfortable but not in distress. Vital signs are stable. Following provision of IV medications the patient had improvement in her symptoms and wished to go home. She will be discharged to follow up with her regular physician        Gerhard Munch, MD 10/01/11 [phone]

## 2011-10-01 NOTE — ED Notes (Signed)
Pt presented to rm 16 from triage c/o headache associated with vomiting x 2 weeks, pt states " i've been having migraine attacks for a while and no medicine is helping at all", pt reported to have seen pmd last night and was medicated with no relief

## 2011-11-05 ENCOUNTER — Emergency Department (HOSPITAL_COMMUNITY)
Admission: EM | Admit: 2011-11-05 | Discharge: 2011-11-05 | Disposition: A | Discharge status: Home / Self Care | Payer: Self-pay | Attending: Emergency Medicine | Admitting: Emergency Medicine

## 2011-11-05 ENCOUNTER — Emergency Department (HOSPITAL_COMMUNITY): Payer: Self-pay

## 2011-11-05 ENCOUNTER — Encounter (HOSPITAL_COMMUNITY): Payer: Self-pay | Admitting: *Deleted

## 2011-11-05 DIAGNOSIS — M5430 Sciatica, unspecified side: Secondary | ICD-10-CM | POA: Insufficient documentation

## 2011-11-05 DIAGNOSIS — R209 Unspecified disturbances of skin sensation: Secondary | ICD-10-CM | POA: Insufficient documentation

## 2011-11-05 DIAGNOSIS — M5432 Sciatica, left side: Secondary | ICD-10-CM

## 2011-11-05 DIAGNOSIS — M545 Low back pain, unspecified: Secondary | ICD-10-CM | POA: Insufficient documentation

## 2011-11-05 HISTORY — DX: Sciatica, left side: M54.32

## 2011-11-05 MED ORDER — HYDROMORPHONE HCL PF 2 MG/ML IJ SOLN
2.0000 mg | Freq: Once | INTRAMUSCULAR | Status: AC
  Administered 2011-11-05: 2 mg via INTRAMUSCULAR
  Filled 2011-11-05: qty 1

## 2011-11-05 MED ORDER — DIAZEPAM 5 MG PO TABS
5.0000 mg | ORAL_TABLET | Freq: Once | ORAL | Status: AC
  Administered 2011-11-05: 5 mg via ORAL
  Filled 2011-11-05: qty 1

## 2011-11-05 MED ORDER — KETOROLAC TROMETHAMINE 60 MG/2ML IM SOLN
60.0000 mg | Freq: Once | INTRAMUSCULAR | Status: AC
  Administered 2011-11-05: 60 mg via INTRAMUSCULAR
  Filled 2011-11-05: qty 2

## 2011-11-05 MED ORDER — ONDANSETRON 8 MG PO TBDP
8.0000 mg | ORAL_TABLET | Freq: Once | ORAL | Status: AC
Start: 2011-11-05 — End: 2011-11-05
  Administered 2011-11-05: 8 mg via ORAL
  Filled 2011-11-05: qty 1

## 2011-11-05 MED ORDER — DIAZEPAM 5 MG PO TABS: 5.0000 mg | ORAL_TABLET | Freq: Three times a day (TID) | ORAL | Status: AC | PRN

## 2011-11-05 MED ORDER — OXYCODONE-ACETAMINOPHEN 7.5-325 MG PO TABS: 1.0000 | ORAL_TABLET | ORAL | Status: AC | PRN

## 2011-11-05 NOTE — ED Notes (Signed)
Pt st's she is having a bad "flare up" of sciatica.  St's she was trying to go through a door on New years day and hit her back and that's what caused this.  St's nothing but Dilaudid will help.

## 2011-11-05 NOTE — ED Notes (Signed)
Pt stating that Toradol and Morphine are ineffective.  Pt states that Dilaudid usually works.

## 2011-11-05 NOTE — ED Notes (Signed)
Pt states that she has a hx of sciatica that occasionally becomes bothersome.  Pt set out with a goal of walking through a door x 4 days ago, pt found the door to not be wide enough to accommodate her and the belongings she was carrying.  Pt had to turn her torso in an odd fashion and that is when her sciatic nerve started to bother her.  Pt states that her left leg is feeling numb and hurting.

## 2011-11-06 NOTE — ED Provider Notes (Signed)
History     CSN: [id number]  Arrival date & time 11/05/11  0051   First MD Initiated Contact with Patient 11/05/11 [phone]      Chief Complaint  Patient presents with  . Sciatica     Patient is a 41 y.o. female presenting with back pain.  Back Pain  This is a recurrent problem. The current episode started more than 2 days ago. The problem occurs constantly. The problem has been gradually worsening. The pain is associated with a recent injury. The pain is present in the lumbar spine. The quality of the pain is described as stabbing. The pain radiates to the left thigh, left knee and left foot. The pain is at a severity of 9/10. The pain is severe. The symptoms are aggravated by bending, twisting and certain positions. The pain is the same all the time. Associated symptoms include numbness. She has tried NSAIDs and analgesics for the symptoms.  Pt reports exacerbation of chronic sciatica w/ recent twisting type injury 4 days ago while walking through a door carrying bulky items. States pain radiates into LLE and also having intermittent numbness to LLE. Denies weakness to LLE, bowel or bladder incontinence or perineal numbness.  Pt states her 7.5/325 mg Percocet not helping. States has had recurrent sciatica since a remote injury resulting in "bulging discs" in her lower back. Attempted to contact PCP Friday but was unable to get in.   Past Medical History  Diagnosis Date  . Migraine   . Knee pain, bilateral   . Asthma   . Sciatica of left side     Past Surgical History  Procedure Date  . Abdominal hysterectomy   . Biopsy breast   . Pelvic exenteration     History reviewed. No pertinent family history.  History  Substance Use Topics  . Smoking status: Current Everyday Smoker -- 0.5 packs/day    Types: Cigarettes  . Smokeless tobacco: Not on file  . Alcohol Use: No    OB History    Grav Para Term Preterm Abortions TAB SAB Ect Mult Living                  Review of Systems    Constitutional: Negative.   HENT: Negative.   Eyes: Negative.   Respiratory: Negative.   Cardiovascular: Negative.   Gastrointestinal: Negative.   Genitourinary: Negative.   Musculoskeletal: Positive for back pain.  Skin: Negative.   Neurological: Positive for numbness.  Hematological: Negative.   Psychiatric/Behavioral: Negative.     Allergies  Morphine and related; Prednisone; Seroquel; and Thorazine  Home Medications   Current Outpatient Rx  Name Route Sig Dispense Refill  . ALMOTRIPTAN MALATE 12.5 MG PO TABS Oral Take 12.5 mg by mouth as needed. may repeat in 2 hours if needed     . ALPRAZOLAM 1 MG PO TABS Oral Take 1 mg by mouth at bedtime as needed. Relief drug for axert    . MELOXICAM 7.5 MG PO TABS Oral Take 7.5 mg by mouth 2 (two) times daily.     Marland Kitchen NORTRIPTYLINE HCL 25 MG PO CAPS Oral Take 25 mg by mouth at bedtime.     . OXYCODONE-ACETAMINOPHEN 7.5-500 MG PO TABS Oral Take 1 tablet by mouth every 4 (four) hours as needed. Pain    . DIAZEPAM 5 MG PO TABS Oral Take 1 tablet (5 mg total) by mouth every 8 (eight) hours as needed (muscle pain/spasm). 10 tablet 0  . KETOROLAC TROMETHAMINE 30 MG/ML IJ  SOLN Intravenous Inject 60 mg into the vein once.      . OXYCODONE-ACETAMINOPHEN 7.5-325 MG PO TABS Oral Take 1 tablet by mouth every 4 (four) hours as needed for pain. 10 tablet 0    BP 121/80  Pulse 66  Temp(Src) 98.1 F (36.7 C) (Oral)  Resp 20  SpO2 99%  Physical Exam  Constitutional: She is oriented to person, place, and time. She appears well-developed and well-nourished.  HENT:  Head: Normocephalic and atraumatic.  Eyes: Conjunctivae are normal.  Neck: Neck supple.  Cardiovascular: Normal rate.   Pulmonary/Chest: Effort normal.  Musculoskeletal: Normal range of motion.       Back:  Neurological: She is alert and oriented to person, place, and time. She has normal reflexes.  Skin: Skin is warm and dry. No erythema.  Psychiatric: She has a normal mood and  affect.    ED Course  Procedures Findings and clinical impression discussed w/ pt. Declined request by pt to provide Rx for Percocet 10/325 mg.She states she does not tolerate Prednisone due to "swelling".  Discussed importance of keeping medications for her chronic pain UTD w/ PCP. Will provide short course of her current regimen (Percocet 7.5 mg) and encourage close f/u w/ PCP for ongoing management of her chronic back pain.  Labs Reviewed - No data to display Dg Lumbar Spine Complete  11/05/2011  *RADIOLOGY REPORT*  Clinical Data: Lower back pain, recent twisting injury.  LUMBAR SPINE - COMPLETE 4+ VIEW  Comparison: 10/12/2009 MRI  Findings: The imaged vertebral bodies and inter-vertebral disc spaces are maintained. No displaced acute fracture or dislocation identified.   The para-vertebral and overlying soft tissues are within normal limits.  IMPRESSION: No acute osseous abnormality.  Original Report Authenticated By: Waneta Martins, M.D.     1. Sciatica of left side       MDM  HPI/PE and clinical findings c/w acute flare of chronic sciatica/LBP w/o focal neurological findings        Leanne Chang, NP 11/06/11 1921  Roma Kayser Schorr, NP 11/07/11 1946  Medical screening examination/treatment/procedure(s) were performed by non-physician practitioner and as supervising physician I was immediately available for consultation/collaboration.   Sunnie Nielsen, MD 11/08/11 [phone]

## 2012-10-27 ENCOUNTER — Emergency Department (INDEPENDENT_AMBULATORY_CARE_PROVIDER_SITE_OTHER)
Admission: EM | Admit: 2012-10-27 | Discharge: 2012-10-27 | Disposition: A | Discharge status: Home / Self Care | Payer: Self-pay | Source: Home / Self Care | Attending: Family Medicine | Admitting: Family Medicine

## 2012-10-27 ENCOUNTER — Encounter (HOSPITAL_COMMUNITY): Payer: Self-pay | Admitting: Emergency Medicine

## 2012-10-27 ENCOUNTER — Telehealth (HOSPITAL_COMMUNITY): Payer: Self-pay | Admitting: Family Medicine

## 2012-10-27 DIAGNOSIS — K089 Disorder of teeth and supporting structures, unspecified: Secondary | ICD-10-CM

## 2012-10-27 DIAGNOSIS — K0889 Other specified disorders of teeth and supporting structures: Secondary | ICD-10-CM

## 2012-10-27 MED ORDER — CLINDAMYCIN HCL 150 MG PO CAPS: 150.0000 mg | ORAL_CAPSULE | Freq: Four times a day (QID) | ORAL | Status: DC

## 2012-10-27 MED ORDER — DICLOFENAC POTASSIUM 50 MG PO TABS: 50.0000 mg | ORAL_TABLET | Freq: Three times a day (TID) | ORAL | Status: DC

## 2012-10-27 NOTE — ED Notes (Signed)
Pt c/o tooth pain x4 months bottom left molar... Pain increased x2 days ago w/swelling... Has appt w/dentis on Monday but was told to come today to the urgent care for antibiotics... Pt is alert w/no signs of acute distress

## 2012-10-27 NOTE — ED Notes (Signed)
Patient called requesting different type of pain medicine.  She states she "looked" up the medication we gave her and it is the same thing as the anti-inflammatory she is currently on.  Patient requesting a different type of pain medication.  Chart reviewed by Dr Artis Flock.  Patient states she is no longer taking the oxycodone.  Dr Artis Flock advised patient to take one anti-inflammatory or the other but not both.  She can also take tylenol for pain.  Patient requesting just "10 of something" to get her through.  Patient advised we could not call in narcotics.  Patient expressed understanding.

## 2012-10-27 NOTE — ED Provider Notes (Signed)
History     CSN: [id number]  Arrival date & time 10/27/12  1235   First MD Initiated Contact with Patient 10/27/12 1243      Chief Complaint  Patient presents with  . Dental Pain    (Consider location/radiation/quality/duration/timing/severity/associated sxs/prior treatment) Patient is a 41 y.o. female presenting with tooth pain. The history is provided by the patient.  Dental PainThe primary symptoms include mouth pain. Primary symptoms do not include fever or shortness of breath. The symptoms began more than 1 month ago (4 mos ago tooth broke, sx worse past 2 days.). The symptoms are worsening. The symptoms occur constantly.  Additional symptoms include: gum swelling, purulent gums, jaw pain and facial swelling. Medical issues include: smoking.    Past Medical History  Diagnosis Date  . Migraine   . Knee pain, bilateral   . Asthma   . Sciatica of left side     Past Surgical History  Procedure Date  . Abdominal hysterectomy   . Biopsy breast   . Pelvic exenteration     No family history on file.  History  Substance Use Topics  . Smoking status: Current Every Day Smoker -- 0.5 packs/day    Types: Cigarettes  . Smokeless tobacco: Not on file  . Alcohol Use: No    OB History    Grav Para Term Preterm Abortions TAB SAB Ect Mult Living                  Review of Systems  Constitutional: Negative.  Negative for fever.  HENT: Positive for facial swelling and dental problem.   Respiratory: Negative for shortness of breath.     Allergies  Morphine and related; Prednisone; Seroquel; and Thorazine  Home Medications   Current Outpatient Rx  Name  Route  Sig  Dispense  Refill  . ALPRAZOLAM 1 MG PO TABS   Oral   Take 1 mg by mouth at bedtime as needed. Relief drug for axert         . ALMOTRIPTAN MALATE 12.5 MG PO TABS   Oral   Take 12.5 mg by mouth as needed. may repeat in 2 hours if needed          . CLINDAMYCIN HCL 150 MG PO CAPS   Oral   Take 1  capsule (150 mg total) by mouth 4 (four) times daily. Take 2 now then 2 at bedtime tonight to start.   28 capsule   0   . DICLOFENAC POTASSIUM 50 MG PO TABS   Oral   Take 1 tablet (50 mg total) by mouth 3 (three) times daily.   15 tablet   0   . KETOROLAC TROMETHAMINE 30 MG/ML IJ SOLN   Intravenous   Inject 60 mg into the vein once.           . MELOXICAM 7.5 MG PO TABS   Oral   Take 7.5 mg by mouth 2 (two) times daily.          Marland Kitchen NORTRIPTYLINE HCL 25 MG PO CAPS   Oral   Take 25 mg by mouth at bedtime.          . OXYCODONE-ACETAMINOPHEN 7.5-500 MG PO TABS   Oral   Take 1 tablet by mouth every 4 (four) hours as needed. Pain           BP 107/72  Pulse 66  Temp 98.3 F (36.8 C) (Oral)  Resp 18  SpO2 97%  Physical Exam  Nursing note and vitals reviewed. Constitutional: She appears well-developed and well-nourished.  HENT:  Head: Normocephalic.  Right Ear: External ear normal.  Mouth/Throat: Oropharynx is clear and moist. Abnormal dentition. Dental abscesses and dental caries present.      ED Course  Procedures (including critical care time)  Labs Reviewed - No data to display No results found.   1. Pain, dental       MDM          Linna Hoff, MD 10/27/12 [phone]

## 2013-01-07 ENCOUNTER — Encounter (HOSPITAL_COMMUNITY): Payer: Self-pay | Admitting: *Deleted

## 2013-01-07 ENCOUNTER — Emergency Department (HOSPITAL_COMMUNITY): Payer: Self-pay

## 2013-01-07 ENCOUNTER — Emergency Department (HOSPITAL_COMMUNITY)
Admission: EM | Admit: 2013-01-07 | Discharge: 2013-01-07 | Disposition: A | Discharge status: Home / Self Care | Payer: Self-pay | Attending: Emergency Medicine | Admitting: Emergency Medicine

## 2013-01-07 DIAGNOSIS — W1809XA Striking against other object with subsequent fall, initial encounter: Secondary | ICD-10-CM | POA: Insufficient documentation

## 2013-01-07 DIAGNOSIS — W19XXXA Unspecified fall, initial encounter: Secondary | ICD-10-CM

## 2013-01-07 DIAGNOSIS — H538 Other visual disturbances: Secondary | ICD-10-CM | POA: Insufficient documentation

## 2013-01-07 DIAGNOSIS — S0010XA Contusion of unspecified eyelid and periocular area, initial encounter: Secondary | ICD-10-CM | POA: Insufficient documentation

## 2013-01-07 DIAGNOSIS — S058X9A Other injuries of unspecified eye and orbit, initial encounter: Secondary | ICD-10-CM | POA: Insufficient documentation

## 2013-01-07 DIAGNOSIS — J45909 Unspecified asthma, uncomplicated: Secondary | ICD-10-CM | POA: Insufficient documentation

## 2013-01-07 DIAGNOSIS — S20229A Contusion of unspecified back wall of thorax, initial encounter: Secondary | ICD-10-CM | POA: Insufficient documentation

## 2013-01-07 DIAGNOSIS — Y92009 Unspecified place in unspecified non-institutional (private) residence as the place of occurrence of the external cause: Secondary | ICD-10-CM | POA: Insufficient documentation

## 2013-01-07 DIAGNOSIS — H5789 Other specified disorders of eye and adnexa: Secondary | ICD-10-CM | POA: Insufficient documentation

## 2013-01-07 DIAGNOSIS — S300XXA Contusion of lower back and pelvis, initial encounter: Secondary | ICD-10-CM

## 2013-01-07 DIAGNOSIS — Y9389 Activity, other specified: Secondary | ICD-10-CM | POA: Insufficient documentation

## 2013-01-07 DIAGNOSIS — Z79899 Other long term (current) drug therapy: Secondary | ICD-10-CM | POA: Insufficient documentation

## 2013-01-07 DIAGNOSIS — F17200 Nicotine dependence, unspecified, uncomplicated: Secondary | ICD-10-CM | POA: Insufficient documentation

## 2013-01-07 DIAGNOSIS — Z8679 Personal history of other diseases of the circulatory system: Secondary | ICD-10-CM | POA: Insufficient documentation

## 2013-01-07 MED ORDER — OXYCODONE-ACETAMINOPHEN 5-325 MG PO TABS
1.0000 | ORAL_TABLET | Freq: Once | ORAL | Status: AC
  Administered 2013-01-07: 1 via ORAL
  Filled 2013-01-07: qty 1

## 2013-01-07 MED ORDER — SULFACETAMIDE SODIUM 10 % OP SOLN: 2.0000 [drp] | OPHTHALMIC | Status: DC

## 2013-01-07 MED ORDER — FLUORESCEIN SODIUM 1 MG OP STRP
1.0000 | ORAL_STRIP | Freq: Once | OPHTHALMIC | Status: DC
  Filled 2013-01-07: qty 1

## 2013-01-07 MED ORDER — OXYCODONE-ACETAMINOPHEN 5-325 MG PO TABS
1.0000 | ORAL_TABLET | ORAL | Status: DC | PRN
Start: 2013-01-07 — End: 2013-02-09

## 2013-01-07 NOTE — ED Notes (Signed)
Pt was bringing logs up steps and fell and aggrevated lower back pain and states hit left eye with fireplace damper.  Left eye red

## 2013-01-07 NOTE — ED Provider Notes (Signed)
History  This chart was scribed for Andrea Booze, MD by Shari Heritage, ED Scribe. The patient was seen in room TR05C/TR05C. Patient's care was started at 1934.   CSN: [id number]  Arrival date & time 01/07/13  1727   First MD Initiated Contact with Patient 01/07/13 1934      Chief Complaint  Patient presents with  . Fall  . Back Pain  . Eye Pain     The history is provided by the patient. No language interpreter was used.     HPI Comments: [name] is a 42 y.o. female with history of left-sided sciatica who presents to the Emergency Department complaining of severe, constant, left lower back pain that radiates down her left leg onset 3 days ago. She rates pain 9/10. Patient states that she was carrying logs up some steps to her apartment when she slipped on some ice and fell aggravating existing sciatica pain. Patient is also complaining of severe left eye pain and eye redness that began 3 days ago. There is associated blurred vision. Pain is exacerbated with certain movements of the eye. Patient states that eye pain began after some smoke from her fireplace blew back in her face. She says that she does not feel like there are any foreign bodies in her eye. No fever, chills, numbness, weakness or tingling of extremities. She denies bladder or bowel incontinence.    Past Medical History  Diagnosis Date  . Migraine   . Knee pain, bilateral   . Asthma   . Sciatica of left side     Past Surgical History  Procedure Laterality Date  . Abdominal hysterectomy    . Biopsy breast    . Pelvic exenteration      No family history on file.  History  Substance Use Topics  . Smoking status: Current Every Day Smoker -- 0.50 packs/day    Types: Cigarettes  . Smokeless tobacco: Not on file  . Alcohol Use: No    OB History   Grav Para Term Preterm Abortions TAB SAB Ect Mult Living                  Review of Systems  Constitutional: Negative for fever and chills.  Eyes:  Positive for redness and visual disturbance.  Gastrointestinal:       Negative for bowel incontinence.   Genitourinary:       Negative for bladder incontinence.  Musculoskeletal: Positive for back pain.  Neurological: Negative for weakness and numbness.    Allergies  Morphine and related; Prednisone; Seroquel; and Thorazine  Home Medications   Current Outpatient Rx  Name  Route  Sig  Dispense  Refill  . almotriptan (AXERT) 12.5 MG tablet   Oral   Take 12.5 mg by mouth as needed for migraine. may repeat in 2 hours if needed         . calcium carbonate (OS-CAL) 600 MG TABS   Oral   Take 600 mg by mouth 2 (two) times daily with a meal.         . citalopram (CELEXA) 20 MG tablet   Oral   Take 20 mg by mouth daily.         . clonazePAM (KLONOPIN) 1 MG tablet   Oral   Take 1 mg by mouth 2 (two) times daily as needed for anxiety.         . diphenhydrAMINE (BENADRYL) 25 MG tablet   Oral   Take 25 mg by mouth  every 6 (six) hours as needed for itching.           Triage Vitals: BP 124/76  Pulse 75  Temp(Src) 98.1 F (36.7 C) (Oral)  Resp 18  SpO2 98%  Physical Exam  Constitutional: She is oriented to person, place, and time. She appears well-developed and well-nourished.  HENT:  Head: Normocephalic and atraumatic.  Eyes: EOM are normal. Pupils are equal, round, and reactive to light. No foreign body present in the left eye.  Slit lamp exam:      The left eye shows fluorescein uptake. The left eye shows no anterior chamber bulge.  Erythema of the conjunctiva of the left eye. Some corneal irregularity noted. Anterior chamber is clear. Upper and lower conjunctival sacs inspected - no foreign bodies. Slit lamp shows mild corneal irregularity.  Anterior chamber clear. Examination after staining with fluorescein and using cobalt blue filter shows significant areas of uptake on the lateral aspect of the left cornea.  Neck: Normal range of motion. Neck supple.   Musculoskeletal: Normal range of motion.  Tender in mid and lower lumbar spine with paralumbar spasm. Positive straight leg raise on the left at 45 degrees.   Neurological: She is alert and oriented to person, place, and time.  Skin: Skin is warm and dry.  Psychiatric: She has a normal mood and affect. Her behavior is normal.    ED Course  Procedures (including critical care time) DIAGNOSTIC STUDIES: Oxygen Saturation is 98% on room air, adequate by my interpretation.    COORDINATION OF CARE: 7:41 PM- Patient informed of current plan for treatment and evaluation and agrees with plan at this time.     Dg Lumbar Spine 2-3 Views  01/07/2013  *RADIOLOGY REPORT*  Clinical Data: Larey Seat down steps.  Pain in lower back for 3 days.  LUMBAR SPINE - 2-3 VIEW  Comparison: Lumbar spine radiographs 11/05/2011  Findings: Lumbar spine vertebral bodies are normal in height and alignment.  Disc spaces are preserved.  The vertebral bodies are intact.  No significant degenerative change is identified.  No evidence of fracture.  Sacroiliac joints are unremarkable.  IMPRESSION: No acute bony abnormality.  No significant degenerative change.   Original Report Authenticated By: Britta Mccreedy, M.D.      1. Injury of conjunctiva and corneal abrasion of left eye without foreign body, initial encounter   2. Fall at home, initial encounter   3. Lumbar contusion, initial encounter       MDM  I agree with evidence of significant abrasion of the cornea of the left eye. Visual acuity is somewhat low but this does seem to be due 2 abrasion in the field. I am concerned that and abrasion should have healed better since the injury occurred 3 days ago. Case has been discussed with Dr. Burgess Estelle of ophthalmology service who agrees to see the patient in followup tomorrow. She's given prescription for sulfacetamide solution and also Percocet for pain. Back injury seems to be a simple contusion. X-rays show no significant  injury.      I personally performed the services described in this documentation, which was scribed in my presence. The recorded information has been reviewed and is accurate.      Andrea Booze, MD 01/07/13 2206

## 2013-01-07 NOTE — ED Notes (Signed)
OS-20/70  OD 20/25  OU- 20/25

## 2013-02-09 ENCOUNTER — Emergency Department (HOSPITAL_COMMUNITY)
Admission: EM | Admit: 2013-02-09 | Discharge: 2013-02-09 | Disposition: A | Discharge status: Home / Self Care | Payer: Self-pay | Attending: Emergency Medicine | Admitting: Emergency Medicine

## 2013-02-09 ENCOUNTER — Emergency Department (HOSPITAL_COMMUNITY): Payer: Self-pay

## 2013-02-09 ENCOUNTER — Encounter (HOSPITAL_COMMUNITY): Payer: Self-pay | Admitting: *Deleted

## 2013-02-09 DIAGNOSIS — W010XXA Fall on same level from slipping, tripping and stumbling without subsequent striking against object, initial encounter: Secondary | ICD-10-CM | POA: Insufficient documentation

## 2013-02-09 DIAGNOSIS — S39012A Strain of muscle, fascia and tendon of lower back, initial encounter: Secondary | ICD-10-CM

## 2013-02-09 DIAGNOSIS — Z8709 Personal history of other diseases of the respiratory system: Secondary | ICD-10-CM | POA: Insufficient documentation

## 2013-02-09 DIAGNOSIS — G43909 Migraine, unspecified, not intractable, without status migrainosus: Secondary | ICD-10-CM | POA: Insufficient documentation

## 2013-02-09 DIAGNOSIS — S335XXA Sprain of ligaments of lumbar spine, initial encounter: Secondary | ICD-10-CM | POA: Insufficient documentation

## 2013-02-09 DIAGNOSIS — IMO0002 Reserved for concepts with insufficient information to code with codable children: Secondary | ICD-10-CM | POA: Insufficient documentation

## 2013-02-09 DIAGNOSIS — Y93K1 Activity, walking an animal: Secondary | ICD-10-CM | POA: Insufficient documentation

## 2013-02-09 DIAGNOSIS — M25569 Pain in unspecified knee: Secondary | ICD-10-CM | POA: Insufficient documentation

## 2013-02-09 DIAGNOSIS — Y929 Unspecified place or not applicable: Secondary | ICD-10-CM | POA: Insufficient documentation

## 2013-02-09 DIAGNOSIS — F17200 Nicotine dependence, unspecified, uncomplicated: Secondary | ICD-10-CM | POA: Insufficient documentation

## 2013-02-09 HISTORY — DX: Connective tissue and disc stenosis of intervertebral foramina of abdomen and other regions: M99.79

## 2013-02-09 MED ORDER — OXYCODONE-ACETAMINOPHEN 5-325 MG PO TABS: 1.0000 | ORAL_TABLET | Freq: Four times a day (QID) | ORAL | Status: DC | PRN

## 2013-02-09 MED ORDER — CYCLOBENZAPRINE HCL 10 MG PO TABS: 10.0000 mg | ORAL_TABLET | Freq: Two times a day (BID) | ORAL | Status: DC | PRN

## 2013-02-09 MED ORDER — NAPROXEN 500 MG PO TABS: 500.0000 mg | ORAL_TABLET | Freq: Two times a day (BID) | ORAL | Status: DC

## 2013-02-09 MED ORDER — HYDROMORPHONE HCL PF 2 MG/ML IJ SOLN
2.0000 mg | Freq: Once | INTRAMUSCULAR | Status: AC
  Administered 2013-02-09: 2 mg via INTRAMUSCULAR
  Filled 2013-02-09: qty 1

## 2013-02-09 NOTE — ED Notes (Signed)
Dog wrapped leash around patient's legs and pulled her down.  Patient fell forward on knees, jarring knees and back.  Severe lower L back pain running down L hip to knees.  Has L4 disc problems, needs to have sx.

## 2013-02-09 NOTE — ED Notes (Signed)
Pt c/o left lower back pain that radiates down left leg after getting tackled up and falling in her dog's leash, Dr. Deretha Emory in prior to RN< see edp assessment for further,

## 2013-02-09 NOTE — ED Provider Notes (Addendum)
History  This chart was scribed for Shelda Jakes, MD by Erskine Emery, ED Scribe. This patient was seen in room APA04/APA04 and the patient's care was started at 16:54.   CSN: [id number]  Arrival date & time 02/09/13  1644   First MD Initiated Contact with Patient 02/09/13 1654      Chief Complaint  Patient presents with  . Fall    (Consider location/radiation/quality/duration/timing/severity/associated sxs/prior treatment) The history is provided by the patient. No language interpreter was used.  [name] is a 42 y.o. female who presents to the Emergency Department complaining of throbbing 10/10 left lower back pain that radiates to the left medial thigh and knees since about 1 hour ago when she fell after her dog's leash got wrapped around her legs and pulled her down onto her knees. 9 years ago the pt had a work-related accident that resulted in L4 disc problems. Pt reports she needs surgery but cannot afford it without insurance. Today's incident aggravated that pain. Pt reports some associated left toe numbness and left leg weakness, but she denies any headache, neck pain, SOB, nausea, emesis, diarrhea, visual changes, rash, dysuria, congestion, rhinorrhea, sore throat, cough, h/o bleeding easily, or confusion.   Pt has no PCP but is looking for one.  Past Medical History  Diagnosis Date  . Migraine   . Knee pain, bilateral   . Asthma   . Sciatica of left side   . Disc narrowing     Past Surgical History  Procedure Laterality Date  . Abdominal hysterectomy    . Biopsy breast    . Pelvic exenteration      History reviewed. No pertinent family history.  History  Substance Use Topics  . Smoking status: Current Every Day Smoker -- 0.50 packs/day    Types: Cigarettes  . Smokeless tobacco: Not on file  . Alcohol Use: No    OB History   Grav Para Term Preterm Abortions TAB SAB Ect Mult Living                  Review of Systems  Constitutional: Negative  for fever and chills.  HENT: Negative for congestion, sore throat, rhinorrhea and neck pain.   Eyes: Negative for visual disturbance.  Respiratory: Negative for cough and shortness of breath.   Cardiovascular: Negative for chest pain.  Gastrointestinal: Negative for nausea, vomiting, abdominal pain and diarrhea.  Genitourinary: Negative for dysuria and hematuria.  Musculoskeletal: Positive for back pain.  Skin: Negative for rash and wound.  Neurological: Positive for weakness and numbness. Negative for headaches.  Hematological: Does not bruise/bleed easily.  Psychiatric/Behavioral: Negative for confusion.    Allergies  Morphine and related; Prednisone; Seroquel; and Thorazine  Home Medications   Current Outpatient Rx  Name  Route  Sig  Dispense  Refill  . citalopram (CELEXA) 20 MG tablet   Oral   Take 20 mg by mouth daily.         . clonazePAM (KLONOPIN) 1 MG tablet   Oral   Take 1 mg by mouth 2 (two) times daily as needed for anxiety.         . naproxen sodium (ALEVE) 220 MG tablet   Oral   Take 220 mg by mouth 2 (two) times daily.         Marland Kitchen almotriptan (AXERT) 12.5 MG tablet   Oral   Take 12.5 mg by mouth as needed for migraine. may repeat in 2 hours if needed         .  cyclobenzaprine (FLEXERIL) 10 MG tablet   Oral   Take 1 tablet (10 mg total) by mouth 2 (two) times daily as needed for muscle spasms.   20 tablet   0   . naproxen (NAPROSYN) 500 MG tablet   Oral   Take 1 tablet (500 mg total) by mouth 2 (two) times daily.   14 tablet   0   . oxyCODONE-acetaminophen (PERCOCET/ROXICET) 5-325 MG per tablet   Oral   Take 1-2 tablets by mouth every 6 (six) hours as needed for pain.   20 tablet   0     Triage Vitals: BP 120/83  Pulse 69  Temp(Src) 98.6 F (37 C) (Oral)  SpO2 100%  Physical Exam  Nursing note and vitals reviewed. Constitutional: She is oriented to person, place, and time. She appears well-developed and well-nourished. No distress.   HENT:  Head: Normocephalic and atraumatic.  Eyes: EOM are normal. Pupils are equal, round, and reactive to light.  Neck: Neck supple. No tracheal deviation present.  Cardiovascular: Normal rate, regular rhythm and normal heart sounds.   No murmur heard. Pulmonary/Chest: Effort normal and breath sounds normal. No respiratory distress.  Abdominal: Soft. Bowel sounds are normal. She exhibits no distension. There is no tenderness.  Musculoskeletal: Normal range of motion. She exhibits no edema and no tenderness.  No swelling of the ankles. Normal toe movement. Lumbar tenderness and left paraspinous muscle tenderness.  Neurological: She is alert and oriented to person, place, and time. No cranial nerve deficit. Coordination normal.  Skin: Skin is warm and dry.  Psychiatric: She has a normal mood and affect.    ED Course  Procedures (including critical care time) DIAGNOSTIC STUDIES: Oxygen Saturation is 100% on room air, normal by my interpretation.    COORDINATION OF CARE: 17:17--I evaluated the patient and we discussed a treatment plan including back x-ray to which the pt agreed.    Labs Reviewed - No data to display Dg Lumbar Spine Complete  02/09/2013  *RADIOLOGY REPORT*  Clinical Data: Fall, pain  LUMBAR SPINE - COMPLETE 4+ VIEW  Comparison: 01/07/2013  Findings: Five lumbar-type vertebral bodies.  Normal lumbar lordosis.  No evidence of fracture or dislocation.  Vertebral body heights and intervertebral disc spaces are maintained.  Mild degenerative changes L4-5 and L5-S1.  Visualized bony pelvis appears intact.  IMPRESSION: No fracture or dislocation is seen.  Mild degenerative changes of the lower lumbar spine.   Original Report Authenticated By: Charline Bills, M.D.      1. Lumbar strain, initial encounter       MDM  No evidence of bony injury. Patient with history of chronic back pain fall today's seems that resulted in an exacerbation of her low back pain. May be some  component of sciatica or nerve irritation with pain radiating down into the leg also has pain in the anterior part leg which be more femoral nerve. Patient improved somewhat with pain medication in the emergency department. Will be discharged home with the anti-inflammatories pain medicine and muscle relaxers.     I personally performed the services described in this documentation, which was scribed in my presence. The recorded information has been reviewed and is accurate.     Shelda Jakes, MD 02/09/13 1819  Shelda Jakes, MD 02/09/13 [phone]

## 2013-02-11 ENCOUNTER — Encounter (HOSPITAL_COMMUNITY): Payer: Self-pay | Admitting: *Deleted

## 2013-02-11 ENCOUNTER — Emergency Department (HOSPITAL_COMMUNITY)
Admission: EM | Admit: 2013-02-11 | Discharge: 2013-02-12 | Disposition: A | Discharge status: Home / Self Care | Payer: Self-pay | Attending: Emergency Medicine | Admitting: Emergency Medicine

## 2013-02-11 DIAGNOSIS — G43909 Migraine, unspecified, not intractable, without status migrainosus: Secondary | ICD-10-CM | POA: Insufficient documentation

## 2013-02-11 DIAGNOSIS — M549 Dorsalgia, unspecified: Secondary | ICD-10-CM

## 2013-02-11 DIAGNOSIS — Z791 Long term (current) use of non-steroidal anti-inflammatories (NSAID): Secondary | ICD-10-CM | POA: Insufficient documentation

## 2013-02-11 DIAGNOSIS — Z79899 Other long term (current) drug therapy: Secondary | ICD-10-CM | POA: Insufficient documentation

## 2013-02-11 DIAGNOSIS — F17200 Nicotine dependence, unspecified, uncomplicated: Secondary | ICD-10-CM | POA: Insufficient documentation

## 2013-02-11 DIAGNOSIS — J45909 Unspecified asthma, uncomplicated: Secondary | ICD-10-CM | POA: Insufficient documentation

## 2013-02-11 DIAGNOSIS — Y929 Unspecified place or not applicable: Secondary | ICD-10-CM | POA: Insufficient documentation

## 2013-02-11 DIAGNOSIS — Z8739 Personal history of other diseases of the musculoskeletal system and connective tissue: Secondary | ICD-10-CM | POA: Insufficient documentation

## 2013-02-11 DIAGNOSIS — Y939 Activity, unspecified: Secondary | ICD-10-CM | POA: Insufficient documentation

## 2013-02-11 DIAGNOSIS — IMO0002 Reserved for concepts with insufficient information to code with codable children: Secondary | ICD-10-CM | POA: Insufficient documentation

## 2013-02-11 DIAGNOSIS — X500XXA Overexertion from strenuous movement or load, initial encounter: Secondary | ICD-10-CM | POA: Insufficient documentation

## 2013-02-11 NOTE — ED Notes (Addendum)
Seen here Saturday for lt leg injury   Lt leg was wrapped around with dogs  leash  Returned because of cont . Pain lt hip , groin  And thigh.

## 2013-02-12 MED ORDER — CYCLOBENZAPRINE HCL 10 MG PO TABS: 10.0000 mg | ORAL_TABLET | Freq: Two times a day (BID) | ORAL | Status: DC | PRN

## 2013-02-12 MED ORDER — IBUPROFEN 800 MG PO TABS: 800.0000 mg | ORAL_TABLET | Freq: Three times a day (TID) | ORAL | Status: DC

## 2013-02-12 MED ORDER — HYDROMORPHONE HCL PF 1 MG/ML IJ SOLN
1.0000 mg | Freq: Once | INTRAMUSCULAR | Status: AC
  Administered 2013-02-12: 1 mg via INTRAMUSCULAR
  Filled 2013-02-12: qty 1

## 2013-02-12 NOTE — ED Provider Notes (Signed)
History     CSN: [id number]  Arrival date & time 02/11/13  2301   First MD Initiated Contact with Patient 02/11/13 2345      Chief Complaint  Patient presents with  . Hip Pain    (Consider location/radiation/quality/duration/timing/severity/associated sxs/prior treatment) HPI History provided by patient. Left lower back and hip pain, chronic and present for years, worse for last 3 days after a twisting injury. No fall. No other injury. Pain is sharp in quality and severe and worse with movement. No associated numbness or tingling. No weakness. No incontinence of bowel or bladder. Patient was seen here a few days ago for the same and received a prescription for Percocet. She is right on medication and requesting more narcotics. She is not seen orthopedic or neurosurgeon or primary care physician and states she is trying to save money so that she can do that. No known alleviating factors. Past Medical History  Diagnosis Date  . Migraine   . Knee pain, bilateral   . Asthma   . Sciatica of left side   . Disc narrowing     Past Surgical History  Procedure Laterality Date  . Abdominal hysterectomy    . Biopsy breast    . Pelvic exenteration      History reviewed. No pertinent family history.  History  Substance Use Topics  . Smoking status: Current Every Day Smoker -- 0.50 packs/day    Types: Cigarettes  . Smokeless tobacco: Not on file  . Alcohol Use: No    OB History   Grav Para Term Preterm Abortions TAB SAB Ect Mult Living                  Review of Systems  Constitutional: Negative for fever and chills.  HENT: Negative for neck pain and neck stiffness.   Eyes: Negative for pain.  Respiratory: Negative for shortness of breath.   Cardiovascular: Negative for chest pain.  Gastrointestinal: Negative for nausea, vomiting and abdominal pain.  Genitourinary: Negative for dysuria.  Musculoskeletal: Positive for back pain.  Skin: Negative for rash.  Neurological:  Negative for headaches.  All other systems reviewed and are negative.    Allergies  Morphine and related; Prednisone; Seroquel; and Thorazine  Home Medications   Current Outpatient Rx  Name  Route  Sig  Dispense  Refill  . almotriptan (AXERT) 12.5 MG tablet   Oral   Take 12.5 mg by mouth as needed for migraine. may repeat in 2 hours if needed         . citalopram (CELEXA) 20 MG tablet   Oral   Take 20 mg by mouth daily.         . clonazePAM (KLONOPIN) 1 MG tablet   Oral   Take 1 mg by mouth 2 (two) times daily as needed for anxiety.         . cyclobenzaprine (FLEXERIL) 10 MG tablet   Oral   Take 1 tablet (10 mg total) by mouth 2 (two) times daily as needed for muscle spasms.   20 tablet   0   . naproxen (NAPROSYN) 500 MG tablet   Oral   Take 1 tablet (500 mg total) by mouth 2 (two) times daily.   14 tablet   0   . naproxen sodium (ALEVE) 220 MG tablet   Oral   Take 220 mg by mouth 2 (two) times daily.         Marland Kitchen oxyCODONE-acetaminophen (PERCOCET/ROXICET) 5-325 MG per tablet  Oral   Take 1-2 tablets by mouth every 6 (six) hours as needed for pain.   20 tablet   0     BP 107/76  Pulse 86  Temp(Src) 97.7 F (36.5 C) (Oral)  Resp 20  Ht 5\' 5"  (1.651 m)  Wt 123 lb (55.792 kg)  BMI 20.47 kg/m2  SpO2 99%  Physical Exam  Constitutional: She is oriented to person, place, and time. She appears well-developed and well-nourished.  HENT:  Head: Normocephalic and atraumatic.  Eyes: EOM are normal. Pupils are equal, round, and reactive to light.  Neck: Neck supple.  No cervical spine tenderness or deformity  Cardiovascular: Normal rate, regular rhythm and intact distal pulses.   Pulmonary/Chest: Effort normal and breath sounds normal. No respiratory distress.  Musculoskeletal: Normal range of motion. She exhibits no edema.  Tender left lower paralumbar without midline tenderness or deformity. No lower extremity deficits with equal DTRs, sensorium to light  touch throughout and dorsi plantar flexion/ knee extension  Neurological: She is alert and oriented to person, place, and time.  Skin: Skin is warm and dry.    ED Course  Procedures (including critical care time)  IM  Dilaudid and prescription for Motrin and Flexeril  Outpatient resources and referrals provided. Holding further narcotics as patient was seen here 2 days ago receiving prescription for Percocet. She states understanding strict return precautions, all discharge and followup instructions MDM  Chronic low back pain with acute exacerbation - no indication for emergent MRI or imaging at this time  Previous Records, vital signs and nursing notes reviewed        Sunnie Nielsen, MD 02/12/13 [phone]

## 2013-06-05 ENCOUNTER — Emergency Department (HOSPITAL_COMMUNITY)
Admission: EM | Admit: 2013-06-05 | Discharge: 2013-06-05 | Disposition: A | Discharge status: Home / Self Care | Payer: Self-pay | Attending: Emergency Medicine | Admitting: Emergency Medicine

## 2013-06-05 ENCOUNTER — Encounter (HOSPITAL_COMMUNITY): Payer: Self-pay

## 2013-06-05 ENCOUNTER — Emergency Department (HOSPITAL_COMMUNITY): Payer: Self-pay

## 2013-06-05 DIAGNOSIS — G43909 Migraine, unspecified, not intractable, without status migrainosus: Secondary | ICD-10-CM | POA: Insufficient documentation

## 2013-06-05 DIAGNOSIS — J45909 Unspecified asthma, uncomplicated: Secondary | ICD-10-CM | POA: Insufficient documentation

## 2013-06-05 DIAGNOSIS — S60219A Contusion of unspecified wrist, initial encounter: Secondary | ICD-10-CM | POA: Insufficient documentation

## 2013-06-05 DIAGNOSIS — Y9389 Activity, other specified: Secondary | ICD-10-CM | POA: Insufficient documentation

## 2013-06-05 DIAGNOSIS — Z8739 Personal history of other diseases of the musculoskeletal system and connective tissue: Secondary | ICD-10-CM | POA: Insufficient documentation

## 2013-06-05 DIAGNOSIS — F17200 Nicotine dependence, unspecified, uncomplicated: Secondary | ICD-10-CM | POA: Insufficient documentation

## 2013-06-05 DIAGNOSIS — S60211A Contusion of right wrist, initial encounter: Secondary | ICD-10-CM

## 2013-06-05 DIAGNOSIS — M25569 Pain in unspecified knee: Secondary | ICD-10-CM | POA: Insufficient documentation

## 2013-06-05 DIAGNOSIS — W64XXXA Exposure to other animate mechanical forces, initial encounter: Secondary | ICD-10-CM | POA: Insufficient documentation

## 2013-06-05 DIAGNOSIS — M5430 Sciatica, unspecified side: Secondary | ICD-10-CM | POA: Insufficient documentation

## 2013-06-05 DIAGNOSIS — M5432 Sciatica, left side: Secondary | ICD-10-CM

## 2013-06-05 DIAGNOSIS — S90511A Abrasion, right ankle, initial encounter: Secondary | ICD-10-CM

## 2013-06-05 DIAGNOSIS — S9000XA Contusion of unspecified ankle, initial encounter: Secondary | ICD-10-CM | POA: Insufficient documentation

## 2013-06-05 DIAGNOSIS — Y929 Unspecified place or not applicable: Secondary | ICD-10-CM | POA: Insufficient documentation

## 2013-06-05 DIAGNOSIS — Z79899 Other long term (current) drug therapy: Secondary | ICD-10-CM | POA: Insufficient documentation

## 2013-06-05 MED ORDER — KETOROLAC TROMETHAMINE 60 MG/2ML IM SOLN
60.0000 mg | Freq: Once | INTRAMUSCULAR | Status: AC
  Administered 2013-06-05: 60 mg via INTRAMUSCULAR
  Filled 2013-06-05: qty 2

## 2013-06-05 MED ORDER — METHOCARBAMOL 500 MG PO TABS
500.0000 mg | ORAL_TABLET | Freq: Once | ORAL | Status: AC
  Administered 2013-06-05: 500 mg via ORAL
  Filled 2013-06-05: qty 1

## 2013-06-05 MED ORDER — IBUPROFEN 600 MG PO TABS: 600.0000 mg | ORAL_TABLET | Freq: Four times a day (QID) | ORAL | Status: DC | PRN

## 2013-06-05 MED ORDER — METHOCARBAMOL 500 MG PO TABS: 500.0000 mg | ORAL_TABLET | Freq: Two times a day (BID) | ORAL | Status: DC

## 2013-06-05 NOTE — ED Provider Notes (Signed)
CSN: [id number]     Arrival date & time 06/05/13  1554 History     First MD Initiated Contact with Patient 06/05/13 1927     Chief Complaint  Patient presents with  . Pain  . Fall   (Consider location/radiation/quality/duration/timing/severity/associated sxs/prior Treatment) HPI Pt with chronic pain , including sciatica and migraines states she was pulled by her dog last night extending her wrist and sustaining minor abrasions to L ankle. She also c/o exacerbation of her L leg sciatica. No weakness or numbness or urinary changes. No head or neck trauma. Pt states she went to her "migraine doctor" this morning before coming to ED for treatment of her injuries.   Past Medical History  Diagnosis Date  . Migraine   . Knee pain, bilateral   . Asthma   . Sciatica of left side   . Disc narrowing    Past Surgical History  Procedure Laterality Date  . Abdominal hysterectomy    . Biopsy breast    . Pelvic exenteration     No family history on file. History  Substance Use Topics  . Smoking status: Current Every Day Smoker -- 0.50 packs/day    Types: Cigarettes  . Smokeless tobacco: Not on file  . Alcohol Use: No   OB History   Grav Para Term Preterm Abortions TAB SAB Ect Mult Living                 Review of Systems  Constitutional: Negative for fever and chills.  HENT: Negative for neck pain and neck stiffness.   Respiratory: Negative for shortness of breath.   Cardiovascular: Negative for chest pain.  Gastrointestinal: Negative for nausea, vomiting and abdominal pain.  Genitourinary: Negative for dysuria, flank pain and difficulty urinating.  Musculoskeletal: Positive for back pain and arthralgias.  Skin: Positive for wound. Negative for rash.  Neurological: Negative for dizziness, weakness, light-headedness, numbness and headaches.  All other systems reviewed and are negative.    Allergies  Morphine and related; Prednisone; Seroquel; and Thorazine  Home Medications    Current Outpatient Rx  Name  Route  Sig  Dispense  Refill  . almotriptan (AXERT) 12.5 MG tablet   Oral   Take 12.5 mg by mouth as needed for migraine. may repeat in 2 hours if needed         . citalopram (CELEXA) 40 MG tablet   Oral   Take 40 mg by mouth daily.         . clonazePAM (KLONOPIN) 1 MG tablet   Oral   Take 1 mg by mouth 2 (two) times daily as needed (for migraines).          . cyclobenzaprine (FLEXERIL) 10 MG tablet   Oral   Take 1 tablet (10 mg total) by mouth 2 (two) times daily as needed for muscle spasms.   20 tablet   0   . ibuprofen (ADVIL,MOTRIN) 200 MG tablet   Oral   Take 400 mg by mouth every 8 (eight) hours as needed for pain.         . Multiple Vitamin (MULTIVITAMIN WITH MINERALS) TABS tablet   Oral   Take 1 tablet by mouth daily.         Marland Kitchen topiramate (TOPAMAX) 100 MG tablet   Oral   Take 100 mg by mouth at bedtime.         Marland Kitchen ibuprofen (ADVIL,MOTRIN) 600 MG tablet   Oral   Take 1 tablet (600 mg  total) by mouth every 6 (six) hours as needed for pain.   30 tablet   0   . methocarbamol (ROBAXIN) 500 MG tablet   Oral   Take 1 tablet (500 mg total) by mouth 2 (two) times daily.   20 tablet   0    BP 138/98  Pulse 71  Temp(Src) 98.1 F (36.7 C) (Oral)  Resp 20  SpO2 99% Physical Exam  Nursing note and vitals reviewed. Constitutional: She is oriented to person, place, and time. She appears well-developed and well-nourished. No distress.  Pt is tearful and ambulating in room without difficulty.   HENT:  Head: Normocephalic and atraumatic.  Mouth/Throat: Oropharynx is clear and moist.  Eyes: EOM are normal. Pupils are equal, round, and reactive to light.  Neck: Normal range of motion. Neck supple.  No posterior cervical spine TTP  Cardiovascular: Normal rate and regular rhythm.   Pulmonary/Chest: Effort normal and breath sounds normal. No respiratory distress. She has no wheezes. She has no rales. She exhibits no tenderness.   Abdominal: Soft. Bowel sounds are normal. She exhibits no distension and no mass. There is no tenderness. There is no rebound and no guarding.  Musculoskeletal: Normal range of motion. She exhibits no edema.  Contusion to dorsum of right wrist with small abrasion to ventral forearm. Question snuffbox tenderness. Small abrasion of posterior R ankle. No ankle deformity or tenderness. Ambulating on ankle with out difficulty. Pain with extension of LLE. Neg straight leg raise. +lumbar TTP diffusely.   Neurological: She is alert and oriented to person, place, and time.  5/5 motor in all ext, sensation intact.   Skin: Skin is warm and dry. No rash noted. No erythema.    ED Course   Procedures (including critical care time)  Labs Reviewed - No data to display Dg Wrist Complete Right  06/05/2013   *RADIOLOGY REPORT*  Clinical Data: Fall, wrist pain  RIGHT WRIST - COMPLETE 3+ VIEW  Comparison: None.  Findings: Normal alignment without fracture.  Distal radius, ulna and carpal bones intact.  No definite soft tissue abnormality.  IMPRESSION: No acute finding.   Original Report Authenticated By: Judie Petit. Shick, M.D.   1. Wrist contusion, right, initial encounter   2. Ankle abrasion, right, initial encounter   3. Sciatica, left     MDM  Pt advised to wear wrist splint and f/u with orthopedics if she remains symptomatic.   Loren Racer, MD 06/05/13 2120

## 2013-06-05 NOTE — ED Notes (Signed)
Pt states she was dragged by her dog down concrete and steps, c/o pain to rt hand, rt ankle, groin to abdominal, buttocks, neck, denies loc

## 2013-06-06 ENCOUNTER — Encounter (HOSPITAL_COMMUNITY): Payer: Self-pay | Admitting: Emergency Medicine

## 2013-06-06 ENCOUNTER — Emergency Department (HOSPITAL_COMMUNITY)
Admission: EM | Admit: 2013-06-06 | Discharge: 2013-06-06 | Disposition: A | Discharge status: Home / Self Care | Payer: Self-pay | Attending: Emergency Medicine | Admitting: Emergency Medicine

## 2013-06-06 DIAGNOSIS — Z79899 Other long term (current) drug therapy: Secondary | ICD-10-CM | POA: Insufficient documentation

## 2013-06-06 DIAGNOSIS — G43909 Migraine, unspecified, not intractable, without status migrainosus: Secondary | ICD-10-CM | POA: Insufficient documentation

## 2013-06-06 DIAGNOSIS — M25539 Pain in unspecified wrist: Secondary | ICD-10-CM | POA: Insufficient documentation

## 2013-06-06 DIAGNOSIS — M25531 Pain in right wrist: Secondary | ICD-10-CM

## 2013-06-06 DIAGNOSIS — J45909 Unspecified asthma, uncomplicated: Secondary | ICD-10-CM | POA: Insufficient documentation

## 2013-06-06 DIAGNOSIS — M545 Low back pain, unspecified: Secondary | ICD-10-CM

## 2013-06-06 DIAGNOSIS — Z8739 Personal history of other diseases of the musculoskeletal system and connective tissue: Secondary | ICD-10-CM | POA: Insufficient documentation

## 2013-06-06 DIAGNOSIS — F17200 Nicotine dependence, unspecified, uncomplicated: Secondary | ICD-10-CM | POA: Insufficient documentation

## 2013-06-06 MED ORDER — TRAMADOL HCL 50 MG PO TABS: 50.0000 mg | ORAL_TABLET | Freq: Four times a day (QID) | ORAL | Status: DC | PRN

## 2013-06-06 MED ORDER — TRAMADOL HCL 50 MG PO TABS
50.0000 mg | ORAL_TABLET | Freq: Once | ORAL | Status: AC
  Administered 2013-06-06: 50 mg via ORAL
  Filled 2013-06-06: qty 1

## 2013-06-06 NOTE — ED Provider Notes (Signed)
CSN: [id number]     Arrival date & time 06/06/13  1038 History     First MD Initiated Contact with Patient 06/06/13 1049     Chief Complaint  Patient presents with  . Arm Pain   (Consider location/radiation/quality/duration/timing/severity/associated sxs/prior Treatment) HPI Comments: Patient presenting with left sided lower back pain and right wrist pain.  She reports that yesterday she was walking her dog on a leash and the dog had pulled the leash.  This caused her to pull a muscle in her lower back and her wrist hit the railing of the stairs.  She denies falling.  She has been ambulatory since the incident.  She was seen in the ED at [facility] yesterday for the same.  She had a xray of her wrist done, which was negative. She was also given a wrist splint.  However, the splint that she was given was for the left wrist and her pain is of the right wrist.  She was also given a prescription for Ibuprofen and Robaxin, which she has been taking for the pain without relief.  She reports that both the pain in the wrist and the pain in the back is worse with movement.    The history is provided by the patient.    Past Medical History  Diagnosis Date  . Migraine   . Knee pain, bilateral   . Asthma   . Sciatica of left side   . Disc narrowing    Past Surgical History  Procedure Laterality Date  . Abdominal hysterectomy    . Biopsy breast    . Pelvic exenteration     No family history on file. History  Substance Use Topics  . Smoking status: Current Every Day Smoker -- 0.50 packs/day    Types: Cigarettes  . Smokeless tobacco: Not on file  . Alcohol Use: No   OB History   Grav Para Term Preterm Abortions TAB SAB Ect Mult Living                 Review of Systems  Musculoskeletal:       Right wrist pain Left lower back pain  All other systems reviewed and are negative.    Allergies  Morphine and related; Prednisone; Seroquel; and Thorazine  Home Medications   Current Outpatient  Rx  Name  Route  Sig  Dispense  Refill  . albuterol (PROVENTIL HFA;VENTOLIN HFA) 108 (90 BASE) MCG/ACT inhaler   Inhalation   Inhale 2 puffs into the lungs every 6 (six) hours as needed for wheezing or shortness of breath.         Marland Kitchen almotriptan (AXERT) 12.5 MG tablet   Oral   Take 12.5 mg by mouth as needed for migraine. may repeat in 2 hours if needed         . citalopram (CELEXA) 40 MG tablet   Oral   Take 40 mg by mouth daily.         . clonazePAM (KLONOPIN) 1 MG tablet   Oral   Take 1 mg by mouth 3 (three) times daily as needed (for migraines).          . cyclobenzaprine (FLEXERIL) 10 MG tablet   Oral   Take 1 tablet (10 mg total) by mouth 2 (two) times daily as needed for muscle spasms.   20 tablet   0   . ibuprofen (ADVIL,MOTRIN) 200 MG tablet   Oral   Take 400 mg by mouth every 8 (eight) hours  as needed for pain.         . Multiple Vitamin (MULTIVITAMIN WITH MINERALS) TABS tablet   Oral   Take 1 tablet by mouth daily.         Marland Kitchen OVER THE COUNTER MEDICATION   Oral   Take 3 tablets by mouth daily. Energy now supplement         . topiramate (TOPAMAX) 100 MG tablet   Oral   Take 100 mg by mouth at bedtime.          BP 110/83  Pulse 90  Temp(Src) 98 F (36.7 C)  Resp 16  SpO2 99% Physical Exam  Nursing note and vitals reviewed. Constitutional: She appears well-developed and well-nourished. No distress.  Cardiovascular: Normal rate, regular rhythm and normal heart sounds.   Pulmonary/Chest: Effort normal and breath sounds normal.  Musculoskeletal:       Right wrist: She exhibits decreased range of motion, bony tenderness and swelling. She exhibits no deformity.       Lumbar back: She exhibits normal range of motion, no tenderness, no bony tenderness, no swelling, no edema and no deformity.  Mild swelling of the dorsal aspect of the right wrist and the right hand. Pain with ROM of the lumbar spine  Neurological: She is alert. She has normal  strength. No sensory deficit. Gait normal.  Skin: Skin is warm and dry. She is not diaphoretic.  Small abrasion to the right wrist Small abrasion to the posterior right ankle  Psychiatric: She has a normal mood and affect.    ED Course   Procedures (including critical care time)  Labs Reviewed - No data to display Dg Wrist Complete Right  06/05/2013   *RADIOLOGY REPORT*  Clinical Data: Fall, wrist pain  RIGHT WRIST - COMPLETE 3+ VIEW  Comparison: None.  Findings: Normal alignment without fracture.  Distal radius, ulna and carpal bones intact.  No definite soft tissue abnormality.  IMPRESSION: No acute finding.   Original Report Authenticated By: Judie Petit. Miles Costain, M.D.   No diagnosis found.  MDM  Patient presenting with pain of the right wrist and left lower back that has been present since yesterday.  She was seen yesterday at [facility] and was given a wrist splint, but it was for the wrong wrist.  Patient given wrist splint for the right wrist today.  No lumbar spinal tenderness on exam.  Pain appears to be muscular.  Patient prescribed muscle relaxer yesterday.  Patient stable for discharge.  Return precautions given.  Pascal [place], PA-C 06/06/13 [phone]

## 2013-06-06 NOTE — ED Notes (Signed)
Ortho came and fitted pt with rt wrist support and a sleeve that was requested by patient

## 2013-06-06 NOTE — ED Notes (Signed)
Pt was seen yesterday at [facility] for same event.  Two nights ago pt was pulled by dog and to stop herself from falling down stairs she wrapped arm around railing.  Pt states she twisted arm, wrist and hand in the railing.  Hand mild edema.  Pt states her lower back pain 10/10, wrist and hand 10/10,  When RN first walked into room to assess pt, pt was standing with face in the corner of the room crying.

## 2013-06-06 NOTE — Progress Notes (Addendum)
Orthopedic Tech Progress Note Patient Details:  [name] 10-08-1971 [id number] Patient was seen at [facility] yesterday but was issued wrong velcro splint (was given left instead of right). Patient given correct velcro splint today but also asked to have wrist/forearm splint in addition to wrist splint given. Stated she needed them for different purposes. Today's supply charge would be for wrist/forearm velcro splint. Patient leaves with 3 braces in all (left brace wrongly issued at [facility], Right wrist, Right wrist/forearm). Charged today for newly issued Right wrist/forearm as requested.  Ortho Devices Type of Ortho Device: Velcro wrist forearm splint Ortho Device/Splint Location: Right Ortho Device/Splint Interventions: Application   Asia R Thompson 06/06/2013, 12:47 PM

## 2013-06-06 NOTE — ED Notes (Signed)
Was seen at [facility] yesterday for pain after dog pulling her down stairs and hurt hand and groin was given splint but for wrong hand

## 2013-06-07 NOTE — ED Provider Notes (Signed)
Medical screening examination/treatment/procedure(s) were performed by non-physician practitioner and as supervising physician I was immediately available for consultation/collaboration.  Shavon Ashmore B. Cheikh Bramble, MD 06/07/13 0714 

## 2013-06-15 ENCOUNTER — Encounter (HOSPITAL_COMMUNITY): Payer: Self-pay | Admitting: *Deleted

## 2013-06-15 ENCOUNTER — Emergency Department (HOSPITAL_COMMUNITY)
Admission: EM | Admit: 2013-06-15 | Discharge: 2013-06-15 | Disposition: A | Discharge status: Home / Self Care | Payer: Self-pay | Attending: Emergency Medicine | Admitting: Emergency Medicine

## 2013-06-15 DIAGNOSIS — M5430 Sciatica, unspecified side: Secondary | ICD-10-CM | POA: Insufficient documentation

## 2013-06-15 DIAGNOSIS — R609 Edema, unspecified: Secondary | ICD-10-CM | POA: Insufficient documentation

## 2013-06-15 DIAGNOSIS — Z791 Long term (current) use of non-steroidal anti-inflammatories (NSAID): Secondary | ICD-10-CM | POA: Insufficient documentation

## 2013-06-15 DIAGNOSIS — G8929 Other chronic pain: Secondary | ICD-10-CM | POA: Insufficient documentation

## 2013-06-15 DIAGNOSIS — M545 Low back pain, unspecified: Secondary | ICD-10-CM | POA: Insufficient documentation

## 2013-06-15 DIAGNOSIS — J45909 Unspecified asthma, uncomplicated: Secondary | ICD-10-CM | POA: Insufficient documentation

## 2013-06-15 DIAGNOSIS — F17200 Nicotine dependence, unspecified, uncomplicated: Secondary | ICD-10-CM | POA: Insufficient documentation

## 2013-06-15 DIAGNOSIS — M25539 Pain in unspecified wrist: Secondary | ICD-10-CM | POA: Insufficient documentation

## 2013-06-15 DIAGNOSIS — M5432 Sciatica, left side: Secondary | ICD-10-CM

## 2013-06-15 DIAGNOSIS — M25531 Pain in right wrist: Secondary | ICD-10-CM

## 2013-06-15 DIAGNOSIS — Z8739 Personal history of other diseases of the musculoskeletal system and connective tissue: Secondary | ICD-10-CM | POA: Insufficient documentation

## 2013-06-15 DIAGNOSIS — G43909 Migraine, unspecified, not intractable, without status migrainosus: Secondary | ICD-10-CM | POA: Insufficient documentation

## 2013-06-15 DIAGNOSIS — Z79899 Other long term (current) drug therapy: Secondary | ICD-10-CM | POA: Insufficient documentation

## 2013-06-15 DIAGNOSIS — R52 Pain, unspecified: Secondary | ICD-10-CM | POA: Insufficient documentation

## 2013-06-15 HISTORY — DX: Other chronic pain: G89.29

## 2013-06-15 HISTORY — DX: Dorsalgia, unspecified: M54.9

## 2013-06-15 LAB — BASIC METABOLIC PANEL WITH GFR
BUN: 15 mg/dL (ref 6–23)
CO2: 27 meq/L (ref 19–32)
Calcium: 9.7 mg/dL (ref 8.4–10.5)
Chloride: 102 meq/L (ref 96–112)
Creatinine, Ser: 1.08 mg/dL (ref 0.50–1.10)
GFR calc Af Amer: 72 mL/min — ABNORMAL LOW
GFR calc non Af Amer: 62 mL/min — ABNORMAL LOW
Glucose, Bld: 80 mg/dL (ref 70–99)
Potassium: 3.6 meq/L (ref 3.5–5.1)
Sodium: 136 meq/L (ref 135–145)

## 2013-06-15 LAB — D-DIMER, QUANTITATIVE: D-Dimer, Quant: <0.27 ug{FEU}/mL (ref 0.00–0.48)

## 2013-06-15 MED ORDER — KETOROLAC TROMETHAMINE 60 MG/2ML IM SOLN
60.0000 mg | Freq: Once | INTRAMUSCULAR | Status: AC
  Administered 2013-06-15: 60 mg via INTRAMUSCULAR
  Filled 2013-06-15: qty 2

## 2013-06-15 MED ORDER — MELOXICAM 7.5 MG PO TABS: 15.0000 mg | ORAL_TABLET | Freq: Every day | ORAL | Status: DC

## 2013-06-15 MED ORDER — TRAMADOL HCL 50 MG PO TABS: 50.0000 mg | ORAL_TABLET | Freq: Four times a day (QID) | ORAL | Status: DC | PRN

## 2013-06-15 MED ORDER — TRAMADOL HCL 50 MG PO TABS
50.0000 mg | ORAL_TABLET | Freq: Once | ORAL | Status: AC
  Administered 2013-06-15: 50 mg via ORAL
  Filled 2013-06-15: qty 1

## 2013-06-15 NOTE — ED Notes (Addendum)
Pt c/o neck and back pain from a recent accident. Pt has been seen at [facility] and [facility]. Pt states she is hurting all over. Describes pain running from her neck down to her buttocks, around her leg to her knee, and down to her ankle and big toe. Pt has a history of sciatica. Pt also reports ankle swelling.

## 2013-06-15 NOTE — ED Provider Notes (Signed)
CSN: [id number]     Arrival date & time 06/15/13  2010 History     First MD Initiated Contact with Patient 06/15/13 2045     Chief Complaint  Patient presents with  . Neck Pain  . Back Pain   (Consider location/radiation/quality/duration/timing/severity/associated sxs/prior Treatment) HPI Comments: [name] is a 42 y.o. Female presenting with multiple ongoing pain complaints including right wrist pain, left lower back pain with radiation to her left mid posterior lower thigh and now bilateral ankle edema, but right greater than left since she caught her right wrist between the stair rail and the wall while walking her dog down a flight of steps 10 days ago.  He pulled on the leash causing her to stumble, but not fall with her hand caught as described.  She was seen at [facility] the day of the event at which time her she was placed in a wrist splint with concern for possible occult scaphoid fracture. She was instructed to follow up with Dr. Ophelia Charter which she has not done.  She has noticed new ankle swelling,  But denies pain in the ankles, calves and denies excessive standing or walking. Additionally she denies cough and shortness of breath.     The history is provided by the patient.    Past Medical History  Diagnosis Date  . Migraine   . Knee pain, bilateral   . Asthma   . Sciatica of left side   . Disc narrowing   . Chronic back pain    Past Surgical History  Procedure Laterality Date  . Abdominal hysterectomy    . Biopsy breast    . Pelvic exenteration     History reviewed. No pertinent family history. History  Substance Use Topics  . Smoking status: Current Every Day Smoker -- 0.50 packs/day    Types: Cigarettes  . Smokeless tobacco: Not on file  . Alcohol Use: No   OB History   Grav Para Term Preterm Abortions TAB SAB Ect Mult Living                 Review of Systems  Constitutional: Negative for fever and chills.  Respiratory: Negative for shortness of breath.    Cardiovascular: Negative for palpitations.  Musculoskeletal: Positive for back pain and arthralgias. Negative for myalgias and joint swelling.  Skin: Negative for color change and wound.  Neurological: Negative for weakness and numbness.    Allergies  Morphine and related; Prednisone; Seroquel; and Thorazine  Home Medications   Current Outpatient Rx  Name  Route  Sig  Dispense  Refill  . citalopram (CELEXA) 40 MG tablet   Oral   Take 40 mg by mouth daily.         . clonazePAM (KLONOPIN) 1 MG tablet   Oral   Take 3 mg by mouth at bedtime.          . Multiple Vitamin (MULTIVITAMIN WITH MINERALS) TABS tablet   Oral   Take 1 tablet by mouth every morning.          . naproxen sodium (ALEVE) 220 MG tablet   Oral   Take 220 mg by mouth daily as needed (for pain).         Marland Kitchen OVER THE COUNTER MEDICATION   Oral   Take 3 tablets by mouth daily. Energy now supplement         . topiramate (TOPAMAX) 100 MG tablet   Oral   Take 100 mg by mouth at bedtime.         Marland Kitchen  albuterol (PROVENTIL HFA;VENTOLIN HFA) 108 (90 BASE) MCG/ACT inhaler   Inhalation   Inhale 2 puffs into the lungs every 6 (six) hours as needed for wheezing or shortness of breath.         . meloxicam (MOBIC) 7.5 MG tablet   Oral   Take 2 tablets (15 mg total) by mouth daily.   15 tablet   0   . traMADol (ULTRAM) 50 MG tablet   Oral   Take 1 tablet (50 mg total) by mouth every 6 (six) hours as needed for pain.   15 tablet   0    BP 120/89  Pulse 76  Temp(Src) 98.3 F (36.8 C)  Resp 20  Ht 5\' 6"  (1.676 m)  Wt 131 lb (59.421 kg)  BMI 21.15 kg/m2  SpO2 97% Physical Exam  Nursing note and vitals reviewed. Constitutional: She appears well-developed and well-nourished.  HENT:  Head: Normocephalic and atraumatic.  Eyes: Conjunctivae are normal.  Neck: Normal range of motion. Neck supple.  Cardiovascular: Normal rate and intact distal pulses.   Pulses equal bilaterally  Pulmonary/Chest:  Effort normal.  Abdominal: Soft. Bowel sounds are normal. She exhibits no distension and no mass.  Musculoskeletal: Normal range of motion. She exhibits edema and tenderness.       Lumbar back: She exhibits tenderness. She exhibits no swelling, no edema and no spasm.  Non pitting edema bilateral ankles, right greater than left,  Trace except at lateral and medial malleolus of the right ankle where it is more pronounced. No ecchymosis, no erythema. No palpable cords or tenderness of calf muscles.  She does have a well healing abrasion right posterior ankle over achilles.  Right scaphoid ttp in wrist.   Neurological: She is alert. She has normal strength. She displays no atrophy, no tremor and normal reflexes. No sensory deficit. Gait normal.  Reflex Scores:      Patellar reflexes are 2+ on the right side and 2+ on the left side.      Achilles reflexes are 2+ on the right side and 2+ on the left side. Equal strength  Skin: Skin is warm and dry.  Psychiatric: She has a normal mood and affect.    ED Course   Procedures (including critical care time)  Labs Reviewed  BASIC METABOLIC PANEL - Abnormal; Notable for the following:    GFR calc non Af Amer 62 (*)    GFR calc Af Amer 72 (*)    All other components within normal limits  D-DIMER, QUANTITATIVE   No results found. 1. Sciatica neuralgia, left   2. Wrist pain, acute, right   3. Peripheral edema     MDM  Patients labs and/or radiological studies were viewed and considered during the medical decision making and disposition process. Patient with acute right wrist pain.  She was encouraged to follow up with ortho.  She was once again referred to Dr Ophelia Charter.  Her labs are neg, specifically ddimer negative, doubt traumatic dvt.  Pt was prescribed mobic, tramadol. Encouraged rest,  Heating pad to areas of pain,  Continue to wear right wrist splint which was provided previously.    Burgess Amor, PA-C 06/16/13 0201

## 2013-06-16 NOTE — ED Provider Notes (Signed)
Medical screening examination/treatment/procedure(s) were performed by non-physician practitioner and as supervising physician I was immediately available for consultation/collaboration.  Donnetta Hutching, MD 06/16/13 [phone]

## 2014-09-25 ENCOUNTER — Emergency Department (HOSPITAL_COMMUNITY)
Admission: EM | Admit: 2014-09-25 | Discharge: 2014-09-25 | Disposition: A | Discharge status: Home / Self Care | Payer: Self-pay | Attending: Emergency Medicine | Admitting: Emergency Medicine

## 2014-09-25 ENCOUNTER — Encounter (HOSPITAL_COMMUNITY): Payer: Self-pay | Admitting: *Deleted

## 2014-09-25 DIAGNOSIS — M5432 Sciatica, left side: Secondary | ICD-10-CM | POA: Insufficient documentation

## 2014-09-25 DIAGNOSIS — G43909 Migraine, unspecified, not intractable, without status migrainosus: Secondary | ICD-10-CM | POA: Insufficient documentation

## 2014-09-25 DIAGNOSIS — J45909 Unspecified asthma, uncomplicated: Secondary | ICD-10-CM | POA: Insufficient documentation

## 2014-09-25 DIAGNOSIS — Z720 Tobacco use: Secondary | ICD-10-CM | POA: Insufficient documentation

## 2014-09-25 DIAGNOSIS — Z87828 Personal history of other (healed) physical injury and trauma: Secondary | ICD-10-CM | POA: Insufficient documentation

## 2014-09-25 DIAGNOSIS — Z79899 Other long term (current) drug therapy: Secondary | ICD-10-CM | POA: Insufficient documentation

## 2014-09-25 MED ORDER — HYDROMORPHONE HCL 1 MG/ML IJ SOLN
1.0000 mg | Freq: Once | INTRAMUSCULAR | Status: AC
  Administered 2014-09-25: 1 mg via INTRAMUSCULAR
  Filled 2014-09-25: qty 1

## 2014-09-25 MED ORDER — NAPROXEN 500 MG PO TABS: 500.0000 mg | ORAL_TABLET | Freq: Two times a day (BID) | ORAL | Status: DC

## 2014-09-25 MED ORDER — METHOCARBAMOL 500 MG PO TABS: 500.0000 mg | ORAL_TABLET | Freq: Two times a day (BID) | ORAL | Status: DC | PRN

## 2014-09-25 MED ORDER — DEXAMETHASONE SODIUM PHOSPHATE 10 MG/ML IJ SOLN
10.0000 mg | Freq: Once | INTRAMUSCULAR | Status: AC
  Administered 2014-09-25: 10 mg via INTRAMUSCULAR
  Filled 2014-09-25: qty 1

## 2014-09-25 MED ORDER — HYDROCODONE-ACETAMINOPHEN 5-325 MG PO TABS: 2.0000 | ORAL_TABLET | ORAL | Status: DC | PRN

## 2014-09-25 MED ORDER — IBUPROFEN 800 MG PO TABS: 800.0000 mg | ORAL_TABLET | Freq: Three times a day (TID) | ORAL | Status: DC

## 2014-09-25 NOTE — ED Notes (Signed)
Pt c/o lower back pain that radiate down the left leg and numbness and tingling.

## 2014-09-25 NOTE — Discharge Instructions (Signed)
Back Pain:   Your back pain should be treated with medicines such as ibuprofen or aleve and this back pain should get better over the next 2 weeks.  However if you develop severe or worsening pain, low back pain with fever, numbness, weakness or inability to walk or urinate, you should return to the ER immediately.  Please follow up with your doctor this week for a recheck if still having symptoms. Low back pain is discomfort in the lower back that may be due to injuries to muscles and ligaments around the spine.  Occasionally, it may be caused by a a problem to a part of the spine called a disc.  The pain may last several days or a week;  However, most patients get completely well in 4 weeks.  Self - care:  The application of heat can help soothe the pain.  Maintaining your daily activities, including walking, is encourged, as it will help you get better faster than just staying in bed.  Medications are also useful to help with pain control.  A commonly prescribed medications includes acetaminophen.  This medication is generally safe, though you should not take more than 8 of the extra strength (500mg ) pills a day.  Non steroidal anti inflammatory medications including Ibuprofen and naproxen;  These medications help both pain and swelling and are very useful in treating back pain.  They should be taken with food, as they can cause stomach upset, and more seriously, stomach bleeding.    Muscle relaxants:  These medications can help with muscle tightness that is a cause of lower back pain.  Most of these medications can cause drowsiness, and it is not safe to drive or use dangerous machinery while taking them.  You will need to follow up with  Your primary healthcare provider in 1-2 weeks for reassessment.  Be aware that if you develop new symptoms, such as a fever, leg weakness, difficulty with or loss of control of your urine or bowels, abdominal pain, or more severe pain, you will need to seek  medical attention and  / or return to the Emergency department.  If you do not have a doctor see the list below.   Emergency Department Resource Guide 1) Find a Doctor and Pay Out of Pocket Although you won't have to find out who is covered by your insurance plan, it is a good idea to ask around and get recommendations. You will then need to call the office and see if the doctor you have chosen will accept you as a new patient and what types of options they offer for patients who are self-pay. Some doctors offer discounts or will set up payment plans for their patients who do not have insurance, but you will need to ask so you aren't surprised when you get to your appointment.  2) Contact Your Local Health Department Not all health departments have doctors that can see patients for sick visits, but many do, so it is worth a call to see if yours does. If you don't know where your local health department is, you can check in your phone book. The CDC also has a tool to help you locate your state's health department, and many state websites also have listings of all of their local health departments.  3) Find a [facility] If your illness is not likely to be very severe or complicated, you may want to try a walk in clinic. These are popping up all over the country in  pharmacies, drugstores, and shopping centers. They're usually staffed by nurse practitioners or physician assistants that have been trained to treat common illnesses and complaints. They're usually fairly quick and inexpensive. However, if you have serious medical issues or chronic medical problems, these are probably not your best option.  No Primary Care Doctor: - Call Health Connect at  [phone] - they can help you locate a primary care doctor that  accepts your insurance, provides certain services, etc. - Physician Referral Service- [phone]  Chronic Pain Problems: Organization         Address  Phone   Notes  [facility]  [phone] Patients need to be referred by their primary care doctor.   Medication Assistance: Organization         Address  Phone   Notes  [facility] Medication [facility] [address] [place], [place] [address] [phone] --Must be a resident of [facility] -- Must have NO insurance coverage whatsoever (no Medicaid/ Medicare, etc.) -- The pt. MUST have a primary care doctor that directs their care regularly and follows them in the community   MedAssist  [phone]   [employer]  [phone]    Agencies that provide inexpensive medical care: Organization         Address  Phone   Notes  [place]  [phone]   [facility]    [phone]   [facility], [place] [address] [phone]   [place] [address] [place] [phone]   Planned Parenthood    [phone]   [facility]    [phone]   [place] and [place] [address], [place] Phone:  [phone], Fax:  [phone] Hours of Operation:  9 am - 6 pm, M-F.  Also accepts Medicaid/Medicare and self-pay.  [facility] for [place] [address], [address], [place] Phone: [phone], Fax: [phone]. Hours of Operation:  8:30 am - 5:30 pm, M-F.  Also accepts Medicaid and self-pay.  [facility] [address], [place] Phone: [phone]   [place], [place], [place] [phone], Ext. [phone] Mondays & Thursdays: 7-9 AM.  First 15 patients are seen on a first come, first serve basis.    [place] Providers:  Organization         Address  Phone   Notes  [facility] [address], [place] [phone] Also accepts self-pay patients.  [facility] [address], [place]  [phone]   [place], [address], [place] [phone]   [facility] [address] [place] [phone]   Lucianne Lei [address], [place]   [phone] Only accepts [place] Access [place] patients after they have their name applied to their card.   Self-Pay (no insurance) in [facility]:  Organization         Address  Phone   Notes  Sickle Cell Patients, [facility] [place] [phone]   [facility] [place] [phone]   [facility] [place]  [place], [address],  [place] [phone]   [facility]/Dr. Osei-Bonsu  [address], [place] or [address], [place] [phone] Phone number for both [place] and [place] locations is the same.  [facility] [address], [place] [phone]   [facility] [address] [place] or [address] [phone]   [facility] [address], [place] [phone], phone; [phone], fax Sees patients 1st and 3rd Saturday of every month.  Must not qualify for public or private insurance (i.e. Medicaid, Medicare, [place] Health Choice, Veterans' Benefits)  Household income should be no more than 200% of the poverty level The clinic cannot treat you if you are pregnant or think you are pregnant  Sexually transmitted diseases are not treated at the clinic.    Dental Care: Organization         Address  Phone  Notes  [facility] Department of [facility] [place] [phone] Accepts children up to age 37 who are enrolled in [place] or [place]; pregnant women with a Medicaid card; and children who have applied for Medicaid  or [place] Health Choice, but were declined, whose parents can pay a reduced fee at time of service.  [facility] Department of [facility]  [address], [place] [phone] Accepts children up to age 80 who are enrolled in [place] or [place]; pregnant women with a Medicaid card; and children who have applied for Medicaid or [place] Health Choice, but were declined, whose parents can pay a reduced fee at time of service.  [place] Adult Dental Access PROGRAM  [place] [phone] Patients are seen by appointment only. Walk-ins are not accepted. [place] will see patients 34 years of age and older. Monday - Tuesday (8am-5pm) Most Wednesdays (8:30-5pm) $30 per visit, cash only  [facility] Adult Dental Access PROGRAM  [address], [facility] [phone] Patients are seen by appointment only. Walk-ins are not accepted. [place] will see patients 69 years of age and older. One Wednesday Evening (Monthly: Volunteer Based).  $30 per visit, cash only  [place]  [phone] for adults; Children under age 52, call Graduate Pediatric Dentistry at [phone]. Children aged 37-14, please call [phone] to request a pediatric application.  Dental services are provided in all areas of dental care including fillings, crowns and bridges, complete and partial dentures, implants, gum treatment, root canals, and extractions. Preventive care is also provided. Treatment is provided to both adults and children. Patients are selected via a lottery and there is often a waiting list.   [facility] [address], [place]  [phone] [email or web address]   [facility] [address] [place], [place] [phone], Ext. [phone] Second and Fourth Thursday of each month, opens at 6:30 AM; Clinic ends at 9 AM.  Patients are seen on a first-come first-served basis, and a limited number are seen  during each clinic.   [facility]  [address] [place] [place], [place] [phone]   Eligibility Requirements You must have lived in [place], [place], or [place] counties for at least the last three months.   You cannot be eligible for state or federal sponsored [employer], including [employer], [place], or [employer].   You generally cannot  be eligible for healthcare insurance through your employer.    How to apply: Eligibility screenings are held every Tuesday and Wednesday afternoon from 1:00 pm until 4:00 pm. You do not need an appointment for the interview!  [facility] [address] [place], [place]   [place]  [place] Department  [place]  [phone]    Behavioral Health Resources in the Community: Intensive Outpatient Programs Organization         Address  Phone  Notes  [facility] [place]. [address] [place], [place] [phone]   [facility] [address] [place], [place]   ADS: [facility] [address] [place], [place]   [place] [address]  [place], [place] or [phone]   Substance Abuse Resources Organization         Address  Phone  Notes  Alcohol and Drug Services  [phone]   [place]  [phone]   [facility] [place]   Chinita Pester  [phone]   Residential & Outpatient Substance Abuse Program  [phone]   Psychological Services Organization         Address  Phone  Notes  [facility] [place]  [place]  [phone]   [place] [address], [place] or [phone]    Mobile Crisis Teams Organization         Address  Phone  Notes  [facility],  Mobile Crisis Care Unit  [phone]   [facility]  [address] [place], [place]   [place] [address], [place] [phone]    Self-Help/Support Groups Organization         Address  Phone             Notes  [place]. of [place] - variety of support groups  [place] Call for more information  Narcotics Anonymous (NA), [facility] [address], [employer] [place]  2 meetings at this location   Special educational needs teacher         Address  Phone  Notes  [facility]idential [facility] [place],    [place]  [phone]   [facility]  [address] [place] [address], [place], [place]   [place] [facility], [place] [phone] Admissions: 8am-3pm M-F  Incentives Substance [place] [address],    [place], [place] [phone]   [facility] [address] [place], [place], [place]   [facility] [address],  [place], [place]   [facility] - Intensive Outpatient [address], [place], [place]   [facility] ([place]ned.) [place].,  [place], [place] [phone] or [phone]   [facility] (RT[facility]) [address] [place], [place] Accepts Medicaid  [facility] [address],  [place] [phone] Substance Abuse/Addiction Treatment   [facility] Organization         Address  Phone  Notes  [facility]  [phone]   Domenic Schwab, PhD [address], [place], [place]   [phone] or [phone]   [place]   [address]  [place], [place] [phone]   [facility] [address], [place], [place] [phone] Insurance/Medicaid/sponsorship through [employer] and Families [address]  [address] [place], [place] [phone] [place], [place] [phone]    Dr. Adele Schilder  [phone]   [facility] Dept. 1) [address], [place] 2) [place] 3)  [address], [place] [phone]  [phone]   [place] [phone] or [phone] (After Hours)

## 2014-09-25 NOTE — ED Provider Notes (Signed)
CSN: [id number]     Arrival date & time 09/25/14  7829 History   First MD Initiated Contact with Patient 09/25/14 0505     Chief Complaint  Patient presents with  . Back Pain  . Leg Pain     (Consider location/radiation/quality/duration/timing/severity/associated sxs/prior Treatment) HPI Comments: The patient is a 43 year old female with a history of sciatica. This always involved the left leg, her left lower back, and seems to flareup every several months. She has been seen by her family doctor, Dr. Alroy Dust. He has referred her to orthopedics with Dr. Joneen Roach who has seen the patient and referred her to physical therapy. The patient states that the physical therapy seems to be not helping, it often makes the pain worse, the pain does not seem to be getting worse in its location that only in the intensity of the pain. There is no associated numbness or weakness. No difficulty urinating. These painful episodes stemmed from a prior accident at work where she slipped and fell on a wet surface  Patient is a 43 y.o. female presenting with back pain and leg pain. The history is provided by the patient.  Back Pain Associated symptoms: leg pain   Associated symptoms: no fever, no numbness and no weakness   Leg Pain Associated symptoms: back pain   Associated symptoms: no fever and no neck pain     Past Medical History  Diagnosis Date  . Migraine   . Knee pain, bilateral   . Asthma   . Sciatica of left side   . Disc narrowing   . Chronic back pain    Past Surgical History  Procedure Laterality Date  . Abdominal hysterectomy    . Biopsy breast    . Pelvic exenteration     No family history on file. History  Substance Use Topics  . Smoking status: Current Every Day Smoker -- 0.50 packs/day    Types: Cigarettes  . Smokeless tobacco: Not on file  . Alcohol Use: No   OB History    No data available     Review of Systems  Constitutional: Negative for fever and chills.   Cardiovascular: Negative for leg swelling.  Gastrointestinal: Negative for nausea and vomiting.       No incontinence of bowel  Genitourinary: Negative for difficulty urinating.       No incontinence or retention  Musculoskeletal: Positive for back pain. Negative for neck pain.  Skin: Negative for rash.  Neurological: Negative for weakness and numbness.      Allergies  Morphine and related; Prednisone; Seroquel; and Thorazine  Home Medications   Prior to Admission medications   Medication Sig Start Date End Date Taking? Authorizing Provider  albuterol (PROVENTIL HFA;VENTOLIN HFA) 108 (90 BASE) MCG/ACT inhaler Inhale 2 puffs into the lungs every 6 (six) hours as needed for wheezing or shortness of breath.   Yes Historical Provider, MD  clonazePAM (KLONOPIN) 1 MG tablet Take 3 mg by mouth at bedtime.    Yes Historical Provider, MD  ibuprofen (ADVIL,MOTRIN) 200 MG tablet Take 200 mg by mouth every 6 (six) hours as needed for mild pain.   Yes Historical Provider, MD  promethazine (PHENERGAN) 25 MG tablet Take 25 mg by mouth every 6 (six) hours as needed for nausea or vomiting.   Yes Historical Provider, MD  rizatriptan (MAXALT) 10 MG tablet Take 10 mg by mouth as needed for migraine. May repeat in 2 hours if needed   Yes Historical Provider, MD  citalopram (  CELEXA) 40 MG tablet Take 40 mg by mouth daily.    Historical Provider, MD  HYDROcodone-acetaminophen (NORCO/VICODIN) 5-325 MG per tablet Take 2 tablets by mouth every 4 (four) hours as needed. 09/25/14   [name], MD  meloxicam (MOBIC) 7.5 MG tablet Take 2 tablets (15 mg total) by mouth daily. Patient not taking: Reported on 09/25/2014 06/15/13   [name], PA-C  methocarbamol (ROBAXIN) 500 MG tablet Take 1 tablet (500 mg total) by mouth 2 (two) times daily as needed for muscle spasms. 09/25/14   [name], MD  Multiple Vitamin (MULTIVITAMIN WITH MINERALS) TABS tablet Take 1 tablet by mouth every morning.     Historical  Provider, MD  naproxen (NAPROSYN) 500 MG tablet Take 1 tablet (500 mg total) by mouth 2 (two) times daily with a meal. 09/25/14   [name], MD  naproxen sodium (ALEVE) 220 MG tablet Take 220 mg by mouth daily as needed (for pain).    Historical Provider, MD  OVER THE COUNTER MEDICATION Take 3 tablets by mouth daily. Energy now supplement    Historical Provider, MD  topiramate (TOPAMAX) 100 MG tablet Take 100 mg by mouth at bedtime.    Historical Provider, MD  traMADol (ULTRAM) 50 MG tablet Take 1 tablet (50 mg total) by mouth every 6 (six) hours as needed for pain. Patient not taking: Reported on 09/25/2014 06/15/13   [name], PA-C   BP 125/93 mmHg  Pulse 95  Temp(Src) 98.3 F (36.8 C) (Oral)  Resp 16  Ht 5\' 7"  (1.702 m)  Wt 131 lb (59.421 kg)  BMI 20.51 kg/m2  SpO2 99% Physical Exam  Constitutional: She appears well-developed and well-nourished. No distress.  HENT:  Head: Normocephalic and atraumatic.  Eyes: Conjunctivae are normal. Right eye exhibits no discharge. Left eye exhibits no discharge. No scleral icterus.  Cardiovascular: Normal rate and regular rhythm.   Pulmonary/Chest: Effort normal and breath sounds normal.  Musculoskeletal: She exhibits no edema.  Tenderness of the back over the left lateral back, left buttock, left SI joint, worse with straight leg raise No tenderness over the Cervical, Thoracic or Lumbar Spine  Neurological:  Speech is clear, strength in the UE and LE's are normal at the major muscle groups including the hip, knee and ankles.  Sensation in tact to light touch and pin prick of the bilateral LE's.  Normal reflexes at the knees bilaterally.  Gait antalgic secondary to pain  Skin: Skin is warm and dry. No rash noted. She is not diaphoretic.    ED Course  Procedures (including critical care time) Labs Review Labs Reviewed - No data to display  Imaging Review No results found.    MDM   Final diagnoses:  Sciatica, left    The  patient appears to be in discomfort from her pain, she has a neurologic exam which is baseline according to her report, I do not find any objective findings to suggest a worsening nerve impingement or condition, at this time we'll treat with pain medications, Decadron, anticipated follow-up in the office with her family doctor and possible referral to pain clinic.  The patient states that steroids make her whole body swelling, this is not an airway problem, it only occurs with prolonged steroid use. She has agreed to one shot of Decadron.  Meds given in ED:  Medications  dexamethasone (DECADRON) injection 10 mg (10 mg Intramuscular Given 09/25/14 0529)  HYDROmorphone (DILAUDID) injection 1 mg (1 mg Intramuscular Given 09/25/14 0529)  New Prescriptions   HYDROCODONE-ACETAMINOPHEN (NORCO/VICODIN) 5-325 MG PER TABLET    Take 2 tablets by mouth every 4 (four) hours as needed.   METHOCARBAMOL (ROBAXIN) 500 MG TABLET    Take 1 tablet (500 mg total) by mouth 2 (two) times daily as needed for muscle spasms.   NAPROXEN (NAPROSYN) 500 MG TABLET    Take 1 tablet (500 mg total) by mouth 2 (two) times daily with a meal.      [name], MD 09/25/14 [phone]

## 2014-09-26 ENCOUNTER — Emergency Department (HOSPITAL_COMMUNITY)
Admission: EM | Admit: 2014-09-26 | Discharge: 2014-09-26 | Disposition: A | Discharge status: Home / Self Care | Payer: Self-pay | Attending: Emergency Medicine | Admitting: Emergency Medicine

## 2014-09-26 ENCOUNTER — Encounter (HOSPITAL_COMMUNITY): Payer: Self-pay | Admitting: *Deleted

## 2014-09-26 DIAGNOSIS — Z791 Long term (current) use of non-steroidal anti-inflammatories (NSAID): Secondary | ICD-10-CM | POA: Insufficient documentation

## 2014-09-26 DIAGNOSIS — Z79899 Other long term (current) drug therapy: Secondary | ICD-10-CM | POA: Insufficient documentation

## 2014-09-26 DIAGNOSIS — J45909 Unspecified asthma, uncomplicated: Secondary | ICD-10-CM | POA: Insufficient documentation

## 2014-09-26 DIAGNOSIS — G43909 Migraine, unspecified, not intractable, without status migrainosus: Secondary | ICD-10-CM | POA: Insufficient documentation

## 2014-09-26 DIAGNOSIS — M79652 Pain in left thigh: Secondary | ICD-10-CM | POA: Insufficient documentation

## 2014-09-26 DIAGNOSIS — M5417 Radiculopathy, lumbosacral region: Secondary | ICD-10-CM

## 2014-09-26 DIAGNOSIS — Z720 Tobacco use: Secondary | ICD-10-CM | POA: Insufficient documentation

## 2014-09-26 DIAGNOSIS — G8929 Other chronic pain: Secondary | ICD-10-CM | POA: Insufficient documentation

## 2014-09-26 MED ORDER — OXYCODONE-ACETAMINOPHEN 5-325 MG PO TABS
2.0000 | ORAL_TABLET | Freq: Once | ORAL | Status: AC
  Administered 2014-09-26: 2 via ORAL
  Filled 2014-09-26: qty 2

## 2014-09-26 MED ORDER — KETOROLAC TROMETHAMINE 60 MG/2ML IM SOLN
60.0000 mg | Freq: Once | INTRAMUSCULAR | Status: AC
  Administered 2014-09-26: 60 mg via INTRAMUSCULAR
  Filled 2014-09-26: qty 2

## 2014-09-26 MED ORDER — OXYCODONE-ACETAMINOPHEN 5-325 MG PO TABS: 1.0000 | ORAL_TABLET | Freq: Three times a day (TID) | ORAL | Status: DC | PRN

## 2014-09-26 NOTE — ED Provider Notes (Signed)
CSN: [id number]     Arrival date & time 09/26/14  1524 History  This chart was scribed for non-physician practitioner working with Andrea Blade, MD by Mercy Moore, ED Scribe. This patient was seen in room TR11C/TR11C and the patient's care was started at 4:48 PM.   Chief Complaint  Patient presents with  . Back Pain   The history is provided by the patient. No language interpreter was used.   HPI Comments: [name] is a 43 y.o. female with 10 year history of chronic lower back pain who presents to the Emergency Department complaining of left lower back pain with left sciatica that has worsened several days ago. Patient was seen here for similar complaint yesterday for this acute on chronic episode; per nurse's note she has exhausted her pain medication. She was given Dilaudid and Decadron in ED. Patient was discharged with Hydrocodone (#10), Robaxin, and ibuprofen, anticipated follow up with her PCP/orthopedist. Per her nurse's note patient denies improvement with treatment and even reports worsening in intensity of her pain. Today patient reports sharp pain in her left thigh and tingling in her left toes. Patient reports that her current episode is the worst that she's had to date. Patient reports treatment with alternating ice and heat and rest in addition to her pain medication, but denies improvement. Patient reports exacerbation with movement, prolonged periods of lying down and reports sleep disturbance due to pain severity. Patient states that she has attempted to contact Dr. Lynann Bologna, but his office is closed today. Patient's last MRI was one month ago. Patient reports deterioration of cartilage at her L3 and L4 vertebra. Patient reports that treatment with steroids makes her swell. Patient reports that she walks "sideways" due to the weakness in her left leg. Patient denies warning symptoms of back pain including: fecal incontinence, urinary retention or overflow incontinence, night  sweats, waking from sleep with back pain, unexplained fevers or weight loss, h/o cancer, IVDU, recent trauma.     Past Medical History  Diagnosis Date  . Migraine   . Knee pain, bilateral   . Asthma   . Sciatica of left side   . Disc narrowing   . Chronic back pain    Past Surgical History  Procedure Laterality Date  . Abdominal hysterectomy    . Biopsy breast    . Pelvic exenteration     History reviewed. No pertinent family history. History  Substance Use Topics  . Smoking status: Current Every Day Smoker -- 0.50 packs/day    Types: Cigarettes  . Smokeless tobacco: Not on file  . Alcohol Use: No   OB History    No data available     Review of Systems  Constitutional: Negative for fever, chills and unexpected weight change.  Gastrointestinal: Negative for vomiting, diarrhea and constipation.       Negative for fecal incontinence.   Genitourinary: Negative for dysuria, hematuria, flank pain, vaginal bleeding, vaginal discharge and pelvic pain.       Negative for urinary incontinence or retention.  Musculoskeletal: Positive for back pain. Negative for gait problem.  Neurological: Positive for weakness (L leg). Negative for numbness.       Denies saddle paresthesias.    Allergies  Morphine and related; Prednisone; Seroquel; and Thorazine  Home Medications   Prior to Admission medications   Medication Sig Start Date End Date Taking? Authorizing Provider  albuterol (PROVENTIL HFA;VENTOLIN HFA) 108 (90 BASE) MCG/ACT inhaler Inhale 2 puffs into the lungs every 6 (six)  hours as needed for wheezing or shortness of breath.    Historical Provider, MD  citalopram (CELEXA) 40 MG tablet Take 40 mg by mouth daily.    Historical Provider, MD  clonazePAM (KLONOPIN) 1 MG tablet Take 3 mg by mouth at bedtime.     Historical Provider, MD  HYDROcodone-acetaminophen (NORCO/VICODIN) 5-325 MG per tablet Take 2 tablets by mouth every 4 (four) hours as needed. 09/25/14   [name], MD   ibuprofen (ADVIL,MOTRIN) 800 MG tablet Take 1 tablet (800 mg total) by mouth 3 (three) times daily. 09/25/14   [name], MD  meloxicam (MOBIC) 7.5 MG tablet Take 2 tablets (15 mg total) by mouth daily. Patient not taking: Reported on 09/25/2014 06/15/13   [name], PA-C  methocarbamol (ROBAXIN) 500 MG tablet Take 1 tablet (500 mg total) by mouth 2 (two) times daily as needed for muscle spasms. 09/25/14   [name], MD  Multiple Vitamin (MULTIVITAMIN WITH MINERALS) TABS tablet Take 1 tablet by mouth every morning.     Historical Provider, MD  naproxen (NAPROSYN) 500 MG tablet Take 1 tablet (500 mg total) by mouth 2 (two) times daily with a meal. 09/25/14   [name], MD  naproxen sodium (ALEVE) 220 MG tablet Take 220 mg by mouth daily as needed (for pain).    Historical Provider, MD  OVER THE COUNTER MEDICATION Take 3 tablets by mouth daily. Energy now supplement    Historical Provider, MD  promethazine (PHENERGAN) 25 MG tablet Take 25 mg by mouth every 6 (six) hours as needed for nausea or vomiting.    Historical Provider, MD  rizatriptan (MAXALT) 10 MG tablet Take 10 mg by mouth as needed for migraine. May repeat in 2 hours if needed    Historical Provider, MD  topiramate (TOPAMAX) 100 MG tablet Take 100 mg by mouth at bedtime.    Historical Provider, MD  traMADol (ULTRAM) 50 MG tablet Take 1 tablet (50 mg total) by mouth every 6 (six) hours as needed for pain. Patient not taking: Reported on 09/25/2014 06/15/13   [name], PA-C   Triage Vitals: BP 129/75 mmHg  Pulse 93  Temp(Src) 98.5 F (36.9 C) (Oral)  Resp 20  SpO2 99% Physical Exam  Constitutional: She is oriented to person, place, and time. She appears well-developed and well-nourished. No distress.  HENT:  Head: Normocephalic and atraumatic.  Eyes: Conjunctivae and EOM are normal.  Neck: Normal range of motion. Neck supple. No tracheal deviation present.  Cardiovascular: Normal rate.   Pulmonary/Chest: Effort  normal. No respiratory distress.  Abdominal: Soft. There is no tenderness. There is no CVA tenderness.  Musculoskeletal: Normal range of motion. She exhibits tenderness.       Right hip: Normal.       Left hip: She exhibits tenderness.       Left knee: Tenderness found.       Left ankle: Tenderness.       Cervical back: Normal.       Thoracic back: Normal.       Lumbar back: She exhibits tenderness. She exhibits normal range of motion and no bony tenderness.       Back:       Left upper leg: She exhibits tenderness.       Left lower leg: She exhibits tenderness.       Left foot: Normal.  No step-off noted with palpation of spine.   Neurological: She is alert and oriented to person,  place, and time. She has normal strength and normal reflexes. No sensory deficit.  5/5 strength in right lower extremity. No sensation deficit. 4+/5 strength flexors/extensors left LE. Reports decreased sensation everywhere in L lower extremity below ankle.   Skin: Skin is warm and dry. No rash noted.  Psychiatric: She has a normal mood and affect. Her behavior is normal.  Nursing note and vitals reviewed.   ED Course  Procedures (including critical care time)  COORDINATION OF CARE: 5:01 PM- Discussed treatment plan with patient at bedside and patient agreed to plan.   Labs Review Labs Reviewed - No data to display  Imaging Review No results found.   EKG Interpretation None       No red flag s/s of low back pain. Patient was counseled on back pain precautions and told to do activity as tolerated but do not lift, push, or pull heavy objects more than 10 pounds for the next week.  Patient counseled to use ice or heat on back for no longer than 15 minutes every hour.   She is told to continue medications prescribed yesterday. Will give #10 Percocet for home. Will give 2 PO Percocet in ED and IM toradol. Neurosurg referral for second opinion.   Patient prescribed narcotic pain medicine and counseled  on proper use of narcotic pain medications. Counseled not to combine this medication with others containing tylenol.   Urged patient not to drink alcohol, drive, or perform any other activities that requires focus while taking either of these medications.  Patient urged to follow-up with PCP if pain does not improve with treatment and rest or if pain becomes recurrent. Urged to return with worsening severe pain, loss of bowel or bladder control, trouble walking.   The patient verbalizes understanding and agrees with the plan.    MDM   Final diagnoses:  Lumbosacral radiculopathy   Patient with back pain. Patient is ambulatory. No warning symptoms of back pain including: fecal incontinence, urinary retention or overflow incontinence, night sweats, waking from sleep with back pain, unexplained fevers or weight loss, h/o cancer, IVDU, recent trauma. No concern for cauda equina, epidural abscess, or other serious cause of back pain. She had reported MRI one month ago and ortho reccs PT. Unfortunately I cannot see the results of this here. Conservative measures such as rest, ice/heat and pain medicine indicated with PCP/ortho/neurosurg follow-up if no improvement with conservative management.    I personally performed the services described in this documentation, which was scribed in my presence. The recorded information has been reviewed and is accurate.    Carlisle Cater, PA-C 09/26/14 [place], MD 09/26/14 [phone]

## 2014-09-26 NOTE — Discharge Instructions (Signed)
Please read and follow all provided instructions.  Your diagnoses today include:  1. Lumbosacral radiculopathy    Tests performed today include:  Vital signs - see below for your results today  Medications prescribed:   Percocet (oxycodone/acetaminophen) - narcotic pain medication  DO NOT drive or perform any activities that require you to be awake and alert because this medicine can make you drowsy. BE VERY CAREFUL not to take multiple medicines containing Tylenol (also called acetaminophen). Doing so can lead to an overdose which can damage your liver and cause liver failure and possibly death.  Take any prescribed medications only as directed.  Home care instructions:   Follow any educational materials contained in this packet  Please rest, use ice or heat on your back for the next several days  Do not lift, push, pull anything more than 10 pounds for the next week  Follow-up instructions: Please follow-up with your primary care provider or the neurosurgeon listed in the next 1 week for further evaluation of your symptoms.   Return instructions:  SEEK IMMEDIATE MEDICAL ATTENTION IF YOU HAVE:  New numbness, tingling, weakness, or problem with the use of your arms or legs  Severe back pain not relieved with medications  Loss control of your bowels or bladder  Increasing pain in any areas of the body (such as chest or abdominal pain)  Shortness of breath, dizziness, or fainting.   Worsening nausea (feeling sick to your stomach), vomiting, fever, or sweats  Any other emergent concerns regarding your health   Additional Information:  Your vital signs today were: BP 132/89 mmHg   Pulse 80   Temp(Src) 98.5 F (36.9 C) (Oral)   Resp 20   SpO2 100% If your blood pressure (BP) was elevated above 135/85 this visit, please have this repeated by your doctor within one month. --------------

## 2014-09-26 NOTE — ED Notes (Signed)
Pt in c/o lower back pain that radiates down her left leg, history of same and seen for this a few days ago, states she is out of her medications at home for the pain

## 2015-01-15 ENCOUNTER — Encounter (HOSPITAL_COMMUNITY): Payer: Self-pay | Admitting: Emergency Medicine

## 2015-01-15 ENCOUNTER — Emergency Department (HOSPITAL_COMMUNITY)
Admission: EM | Admit: 2015-01-15 | Discharge: 2015-01-15 | Discharge status: Against Medical Advice | Payer: Self-pay | Attending: Emergency Medicine | Admitting: Emergency Medicine

## 2015-01-15 DIAGNOSIS — J45909 Unspecified asthma, uncomplicated: Secondary | ICD-10-CM | POA: Insufficient documentation

## 2015-01-15 DIAGNOSIS — M549 Dorsalgia, unspecified: Secondary | ICD-10-CM | POA: Insufficient documentation

## 2015-01-15 DIAGNOSIS — G8929 Other chronic pain: Secondary | ICD-10-CM | POA: Insufficient documentation

## 2015-01-15 DIAGNOSIS — Z720 Tobacco use: Secondary | ICD-10-CM | POA: Insufficient documentation

## 2015-01-15 DIAGNOSIS — R109 Unspecified abdominal pain: Secondary | ICD-10-CM | POA: Insufficient documentation

## 2015-01-15 DIAGNOSIS — R112 Nausea with vomiting, unspecified: Secondary | ICD-10-CM | POA: Insufficient documentation

## 2015-01-15 DIAGNOSIS — R2243 Localized swelling, mass and lump, lower limb, bilateral: Secondary | ICD-10-CM | POA: Insufficient documentation

## 2015-01-15 DIAGNOSIS — M1990 Unspecified osteoarthritis, unspecified site: Secondary | ICD-10-CM | POA: Insufficient documentation

## 2015-01-15 LAB — COMPREHENSIVE METABOLIC PANEL
ALBUMIN: 4.5 g/dL (ref 3.5–5.2)
ALK PHOS: 51 U/L (ref 39–117)
ALT: 22 U/L (ref 0–35)
AST: 23 U/L (ref 0–37)
Anion gap: 4 — ABNORMAL LOW (ref 5–15)
BUN: 13 mg/dL (ref 6–23)
CO2: 23 mmol/L (ref 19–32)
Calcium: 9.2 mg/dL (ref 8.4–10.5)
Chloride: 112 mmol/L (ref 96–112)
Creatinine, Ser: 0.64 mg/dL (ref 0.50–1.10)
GFR calc Af Amer: >90 mL/min (ref >90)
GLUCOSE: 97 mg/dL (ref 70–99)
POTASSIUM: 3.8 mmol/L (ref 3.5–5.1)
Sodium: 139 mmol/L (ref 135–145)
TOTAL PROTEIN: 7.2 g/dL (ref 6.0–8.3)
Total Bilirubin: 0.3 mg/dL (ref 0.3–1.2)

## 2015-01-15 LAB — CBC WITH DIFFERENTIAL/PLATELET
Basophils Absolute: 0 10*3/uL (ref 0.0–0.1)
Basophils Relative: 0 % (ref 0–1)
Eosinophils Absolute: 0.2 10*3/uL (ref 0.0–0.7)
Eosinophils Relative: 3 % (ref 0–5)
HEMATOCRIT: 41.3 % (ref 36.0–46.0)
Hemoglobin: 13.7 g/dL (ref 12.0–15.0)
Lymphocytes Relative: 29 % (ref 12–46)
Lymphs Abs: 2.5 10*3/uL (ref 0.7–4.0)
MCH: 31.7 pg (ref 26.0–34.0)
MCHC: 33.2 g/dL (ref 30.0–36.0)
MCV: 95.6 fL (ref 78.0–100.0)
MONOS PCT: 6 % (ref 3–12)
Monocytes Absolute: 0.5 10*3/uL (ref 0.1–1.0)
NEUTROS ABS: 5.2 10*3/uL (ref 1.7–7.7)
Neutrophils Relative %: 62 % (ref 43–77)
PLATELETS: 325 10*3/uL (ref 150–400)
RBC: 4.32 MIL/uL (ref 3.87–5.11)
RDW: 12.7 % (ref 11.5–15.5)
WBC: 8.4 10*3/uL (ref 4.0–10.5)

## 2015-01-15 LAB — URINALYSIS, ROUTINE W REFLEX MICROSCOPIC
Bilirubin Urine: NEGATIVE
GLUCOSE, UA: NEGATIVE mg/dL
Hgb urine dipstick: NEGATIVE
Ketones, ur: NEGATIVE mg/dL
Nitrite: POSITIVE — AB
Protein, ur: NEGATIVE mg/dL
Specific Gravity, Urine: 1.023 (ref 1.005–1.030)
Urobilinogen, UA: 0.2 mg/dL (ref 0.0–1.0)
pH: 5.5 (ref 5.0–8.0)

## 2015-01-15 LAB — URINE MICROSCOPIC-ADD ON

## 2015-01-15 LAB — LIPASE, BLOOD: Lipase: 21 U/L (ref 11–59)

## 2015-01-15 NOTE — ED Notes (Signed)
Pt called out stating she is hurting really bad and needs a pain pill, pt states she's hurting so bad and can't wait for a doctor, pt given warm packs to place on legs and back until EDP sees her.

## 2015-01-15 NOTE — ED Notes (Signed)
Pt states for the past 3 days she has been having swelling in her lower extremities Pt states she is having pain all over in her joints and bones  Pt is also c/o abdominal and back pain   States she has nausea and vomited the other day but not today  Pt states her hands have been swollen and she feels short of breath  Pt is crying in triage

## 2015-01-15 NOTE — ED Notes (Signed)
Pt walked out of room stating "I am leaving, I called for you and no one came", pt informed I have done my best but am busy at the time, pt kept walking and left.

## 2015-04-14 ENCOUNTER — Encounter (HOSPITAL_COMMUNITY): Payer: Self-pay

## 2015-04-14 DIAGNOSIS — G43909 Migraine, unspecified, not intractable, without status migrainosus: Secondary | ICD-10-CM | POA: Insufficient documentation

## 2015-04-14 DIAGNOSIS — R600 Localized edema: Secondary | ICD-10-CM | POA: Insufficient documentation

## 2015-04-14 DIAGNOSIS — M7989 Other specified soft tissue disorders: Secondary | ICD-10-CM | POA: Insufficient documentation

## 2015-04-14 DIAGNOSIS — Z79899 Other long term (current) drug therapy: Secondary | ICD-10-CM | POA: Insufficient documentation

## 2015-04-14 DIAGNOSIS — K088 Other specified disorders of teeth and supporting structures: Secondary | ICD-10-CM | POA: Insufficient documentation

## 2015-04-14 DIAGNOSIS — Z720 Tobacco use: Secondary | ICD-10-CM | POA: Insufficient documentation

## 2015-04-14 DIAGNOSIS — Z791 Long term (current) use of non-steroidal anti-inflammatories (NSAID): Secondary | ICD-10-CM | POA: Insufficient documentation

## 2015-04-14 DIAGNOSIS — J45909 Unspecified asthma, uncomplicated: Secondary | ICD-10-CM | POA: Insufficient documentation

## 2015-04-14 DIAGNOSIS — R140 Abdominal distension (gaseous): Secondary | ICD-10-CM | POA: Insufficient documentation

## 2015-04-14 DIAGNOSIS — G8929 Other chronic pain: Secondary | ICD-10-CM | POA: Insufficient documentation

## 2015-04-14 NOTE — ED Notes (Signed)
Pt has had 4 teeth pulled in the past 3 weeks, had 1 pulled yesterday left upper molar.  Pt have severe pain on upper right and left teeth.  Pt was prescribed Norco and Ibuprofenl.  Pt reports several months of swellilng in bilateral upper and lower legs and abdomen.  3 days of decreased urination.  No shortness of breath.

## 2015-04-14 NOTE — ED Notes (Signed)
Pt tearful at triage

## 2015-04-15 ENCOUNTER — Emergency Department (HOSPITAL_COMMUNITY)
Admission: EM | Admit: 2015-04-15 | Discharge: 2015-04-15 | Disposition: A | Discharge status: Home / Self Care | Payer: Self-pay | Attending: Emergency Medicine | Admitting: Emergency Medicine

## 2015-04-15 DIAGNOSIS — R600 Localized edema: Secondary | ICD-10-CM

## 2015-04-15 DIAGNOSIS — K0889 Other specified disorders of teeth and supporting structures: Secondary | ICD-10-CM

## 2015-04-15 MED ORDER — METOCLOPRAMIDE HCL 10 MG PO TABS
10.0000 mg | ORAL_TABLET | Freq: Once | ORAL | Status: AC
  Administered 2015-04-15: 10 mg via ORAL
  Filled 2015-04-15: qty 1

## 2015-04-15 MED ORDER — KETOROLAC TROMETHAMINE 60 MG/2ML IM SOLN
60.0000 mg | Freq: Once | INTRAMUSCULAR | Status: AC
  Administered 2015-04-15: 60 mg via INTRAMUSCULAR
  Filled 2015-04-15: qty 2

## 2015-04-15 MED ORDER — FUROSEMIDE 20 MG PO TABS: 20.0000 mg | ORAL_TABLET | Freq: Every day | ORAL | Status: DC

## 2015-04-15 NOTE — ED Notes (Signed)
Pt very upset that we are unable to give her any other pain medications. MD aware.

## 2015-04-15 NOTE — ED Provider Notes (Addendum)
CSN: [id number]     Arrival date & time 04/14/15  2236 History  This chart was scribed for Andrea Balls, MD by Delphia Grates, ED Scribe. This patient was seen in room B15C/B15C and the patient's care was started at 2:15 AM.   Chief Complaint  Patient presents with  . Leg Swelling  . Bloated  . Dental Pain    The history is provided by the patient. No language interpreter was used.   HPI Comments: [name] is a 44 y.o. female who presents to the Emergency Department complaining of constant, sharp, aching, 10/10, left upper dental pain since yesterday. Patient states she had 4 teeth extracted in the last 3 weeks and 1 pulled yesterday (left upper molar), and reports pain since. There is associated left sided facial swelling, headache, and sore throat. She was prescribed Vicodin and ibuprofen without relief. Patient has yet to schedule an appointment with her dentist for further evaluation.   Patient is also complaining of intermittent BLE swelling for the past several months, that has gotten worse over the last 2 weeks. There is associated retention and decreased urinary output. She reports history of the same and has taken Lasix, in the past, with improvement of symptoms. She denies SOB.    Past Medical History  Diagnosis Date  . Migraine   . Knee pain, bilateral   . Asthma   . Sciatica of left side   . Disc narrowing   . Chronic back pain    Past Surgical History  Procedure Laterality Date  . Abdominal hysterectomy    . Biopsy breast    . Pelvic exenteration     Family History  Problem Relation Age of Onset  . Diabetes Other   . CAD Other    History  Substance Use Topics  . Smoking status: Current Every Day Smoker -- 0.50 packs/day    Types: Cigarettes  . Smokeless tobacco: Not on file  . Alcohol Use: No   OB History    No data available     Review of Systems  A complete 10 system review of systems was obtained and all systems are negative except as noted  in the HPI and PMH.   Allergies  Morphine and related; Prednisone; Seroquel; and Thorazine  Home Medications   Prior to Admission medications   Medication Sig Start Date End Date Taking? Authorizing Provider  acetaminophen (TYLENOL) 500 MG tablet Take 1,000 mg by mouth every 6 (six) hours as needed for mild pain.    Historical Provider, MD  albuterol (PROVENTIL HFA;VENTOLIN HFA) 108 (90 BASE) MCG/ACT inhaler Inhale 2 puffs into the lungs every 6 (six) hours as needed for wheezing or shortness of breath.    Historical Provider, MD  amitriptyline (ELAVIL) 10 MG tablet Take 20 mg by mouth at bedtime. Take one po q HS for one week and then increase to two po q HS 12/09/14   Historical Provider, MD  clonazePAM (KLONOPIN) 1 MG tablet Take 3 mg by mouth at bedtime.     Historical Provider, MD  HYDROcodone-acetaminophen (NORCO/VICODIN) 5-325 MG per tablet Take 2 tablets by mouth every 4 (four) hours as needed. Patient not taking: Reported on 01/15/2015 09/25/14   Noemi Chapel, MD  ibuprofen (ADVIL,MOTRIN) 800 MG tablet Take 1 tablet (800 mg total) by mouth 3 (three) times daily. 09/25/14   Noemi Chapel, MD  meloxicam (MOBIC) 7.5 MG tablet Take 2 tablets (15 mg total) by mouth daily. Patient not taking: Reported on 09/25/2014 06/15/13  [name], PA-C  methocarbamol (ROBAXIN) 500 MG tablet Take 1 tablet (500 mg total) by mouth 2 (two) times daily as needed for muscle spasms. Patient not taking: Reported on 01/15/2015 09/25/14   Noemi Chapel, MD  Multiple Vitamin (MULTIVITAMIN WITH MINERALS) TABS tablet Take 1 tablet by mouth every morning.     Historical Provider, MD  naproxen (NAPROSYN) 500 MG tablet Take 1 tablet (500 mg total) by mouth 2 (two) times daily with a meal. Patient not taking: Reported on 01/15/2015 09/25/14   Noemi Chapel, MD  OVER THE COUNTER MEDICATION Take 3 tablets by mouth daily. Energy now supplement    Historical Provider, MD  oxyCODONE-acetaminophen (PERCOCET/ROXICET) 5-325 MG per  tablet Take 1-2 tablets by mouth every 8 (eight) hours as needed for severe pain. 09/26/14   Carlisle Cater, PA-C  promethazine (PHENERGAN) 25 MG tablet Take 25 mg by mouth every 6 (six) hours as needed for nausea or vomiting.    Historical Provider, MD  rizatriptan (MAXALT) 10 MG tablet Take 10 mg by mouth as needed for migraine. May repeat in 2 hours if needed    Historical Provider, MD  traMADol (ULTRAM) 50 MG tablet Take 1 tablet (50 mg total) by mouth every 6 (six) hours as needed for pain. Patient not taking: Reported on 09/25/2014 06/15/13   [name], PA-C   Triage Vitals: BP 126/91 mmHg  Pulse 78  Temp(Src) 98.2 F (36.8 C) (Oral)  Resp 20  Ht 5\' 6"  (1.676 m)  Wt 140 lb 2 oz (63.56 kg)  BMI 22.63 kg/m2  SpO2 100%  Physical Exam  Constitutional: She is oriented to person, place, and time. She appears well-developed and well-nourished. No distress.  HENT:  Head: Normocephalic and atraumatic.  Nose: Nose normal.  Mouth/Throat: Oropharynx is clear and moist. No oropharyngeal exudate.  Teeth 1, 32, 16, 17 are missing.  There is poor dentition in adjacent teeth.  No swelling or abscess formation.  No tenderness to floor of mouth.  Eyes: Conjunctivae and EOM are normal. Pupils are equal, round, and reactive to light. No scleral icterus.  Neck: Normal range of motion. Neck supple. No JVD present. No tracheal deviation present. No thyromegaly present.  Cardiovascular: Normal rate, regular rhythm and normal heart sounds.  Exam reveals no gallop and no friction rub.   No murmur heard. Pulmonary/Chest: Effort normal and breath sounds normal. No respiratory distress. She has no wheezes. She exhibits no tenderness.  Abdominal: Soft. Bowel sounds are normal. She exhibits no distension and no mass. There is no tenderness. There is no rebound and no guarding.  Musculoskeletal: Normal range of motion. She exhibits no edema or tenderness.  Lymphadenopathy:    She has no cervical adenopathy.   Neurological: She is alert and oriented to person, place, and time. No cranial nerve deficit. She exhibits normal muscle tone.  Skin: Skin is warm and dry. No rash noted. No erythema. No pallor.  Nursing note and vitals reviewed.   ED Course  Procedures (including critical care time)  DIAGNOSTIC STUDIES: Oxygen Saturation is 100% on room air, normal by my interpretation.    COORDINATION OF CARE: At 0224 Discussed treatment plan with patient which includes pain medication. Patient agrees.   Labs Review Labs Reviewed - No data to display  Imaging Review No results found.   EKG Interpretation None      MDM   Final diagnoses:  None    Patient since emergency department for dental pain for the past 3 weeks.  She recently had her teeth pulled. She feels that there is a hole whether should not be. Physical exam is unremarkable. There is no mass or swelling or abscess formation. She was given Toradol injection the emergency department. I do not prescribe narcotics for dental pain, she is advised to continue her medications at home. Dental follow-up was provided. She also be given a short course of Lasix for her peripheral edema, she states this helped her in the past.  She has no further complaints and appears well.  She is in NAD, her VS remain within her normal limitrs and she is safe forDC.  I personally performed the services described in this documentation, which was scribed in my presence. The recorded information has been reviewed and is accurate.    Andrea Balls, MD 04/15/15 [id number]  Andrea Balls, MD 04/15/15 [phone]

## 2015-04-15 NOTE — Discharge Instructions (Signed)
Dental Pain [name], see a dentist within 3 days to have your teeth evaluated.  Take lasix as prescribed to help with swelling and see a primary care physician for care.  If symptoms worsen, come back to the ED immediately.  Thank you. Toothache is pain in or around a tooth. It may get worse with chewing or with cold or heat.  HOME CARE  Your dentist may use a numbing medicine during treatment. If so, you may need to avoid eating until the medicine wears off. Ask your dentist about this.  Only take medicine as told by your dentist or doctor.  Avoid chewing food near the painful tooth until after all treatment is done. Ask your dentist about this. GET HELP RIGHT AWAY IF:   The problem gets worse or new problems appear.  You have a fever.  There is redness and puffiness (swelling) of the face, jaw, or neck.  You cannot open your mouth.  There is pain in the jaw.  There is very bad pain that is not helped by medicine. MAKE SURE YOU:   Understand these instructions.  Will watch your condition.  Will get help right away if you are not doing well or get worse. Document Released: 04/04/2008 Document Revised: 01/09/2012 Document Reviewed: 04/04/2008 [facility] Patient Information 2015 [place], [place]. This information is not intended to replace advice given to you by your health care provider. Make sure you discuss any questions you have with your health care provider. Edema Edema is an abnormal buildup of fluids. It is more common in your legs and thighs. Painless swelling of the feet and ankles is more likely as a person ages. It also is common in looser skin, like around your eyes. HOME CARE   Keep the affected body part above the level of the heart while lying down.  Do not sit still or stand for a long time.  Do not put anything right under your knees when you lie down.  Do not wear tight clothes on your upper legs.  Exercise your legs to help the puffiness (swelling) go  down.  Wear elastic bandages or support stockings as told by your doctor.  A low-salt diet may help lessen the puffiness.  Only take medicine as told by your doctor. GET HELP IF:  Treatment is not working.  You have heart, liver, or kidney disease and notice that your skin looks puffy or shiny.  You have puffiness in your legs that does not get better when you raise your legs.  You have sudden weight gain for no reason. GET HELP RIGHT AWAY IF:   You have shortness of breath or chest pain.  You cannot breathe when you lie down.  You have pain, redness, or warmth in the areas that are puffy.  You have heart, liver, or kidney disease and get edema all of a sudden.  You have a fever and your symptoms get worse all of a sudden. MAKE SURE YOU:   Understand these instructions.  Will watch your condition.  Will get help right away if you are not doing well or get worse. Document Released: 04/04/2008 Document Revised: 10/22/2013 Document Reviewed: 08/09/2013 [facility] Patient Information 2015 [place], [place]. This information is not intended to replace advice given to you by your health care provider. Make sure you discuss any questions you have with your health care provider.

## 2015-04-15 NOTE — ED Notes (Signed)
MD at bedside. 

## 2015-10-12 ENCOUNTER — Encounter (HOSPITAL_COMMUNITY): Payer: Self-pay | Admitting: Emergency Medicine

## 2015-10-12 DIAGNOSIS — F17210 Nicotine dependence, cigarettes, uncomplicated: Secondary | ICD-10-CM | POA: Insufficient documentation

## 2015-10-12 DIAGNOSIS — Z79899 Other long term (current) drug therapy: Secondary | ICD-10-CM | POA: Insufficient documentation

## 2015-10-12 DIAGNOSIS — G43909 Migraine, unspecified, not intractable, without status migrainosus: Secondary | ICD-10-CM | POA: Insufficient documentation

## 2015-10-12 DIAGNOSIS — R1111 Vomiting without nausea: Secondary | ICD-10-CM | POA: Insufficient documentation

## 2015-10-12 DIAGNOSIS — Z859 Personal history of malignant neoplasm, unspecified: Secondary | ICD-10-CM | POA: Insufficient documentation

## 2015-10-12 DIAGNOSIS — R1012 Left upper quadrant pain: Secondary | ICD-10-CM | POA: Insufficient documentation

## 2015-10-12 DIAGNOSIS — G8929 Other chronic pain: Secondary | ICD-10-CM | POA: Insufficient documentation

## 2015-10-12 DIAGNOSIS — R1011 Right upper quadrant pain: Secondary | ICD-10-CM | POA: Insufficient documentation

## 2015-10-12 DIAGNOSIS — J45909 Unspecified asthma, uncomplicated: Secondary | ICD-10-CM | POA: Insufficient documentation

## 2015-10-12 LAB — CBC
HCT: 41.9 % (ref 36.0–46.0)
Hemoglobin: 13.9 g/dL (ref 12.0–15.0)
MCH: 31 pg (ref 26.0–34.0)
MCHC: 33.2 g/dL (ref 30.0–36.0)
MCV: 93.5 fL (ref 78.0–100.0)
PLATELETS: 340 10*3/uL (ref 150–400)
RBC: 4.48 MIL/uL (ref 3.87–5.11)
RDW: 13 % (ref 11.5–15.5)
WBC: 13.3 10*3/uL — AB (ref 4.0–10.5)

## 2015-10-12 NOTE — ED Notes (Signed)
Pt. reports left chest pain radiating to upper abdomen and upper back with emesis onset 2 weeks ago , denies diaphoresis , pain increases when eating and drinking.

## 2015-10-13 ENCOUNTER — Emergency Department (HOSPITAL_COMMUNITY): Payer: Self-pay

## 2015-10-13 ENCOUNTER — Encounter (HOSPITAL_COMMUNITY): Payer: Self-pay

## 2015-10-13 ENCOUNTER — Emergency Department (HOSPITAL_COMMUNITY)
Admission: EM | Admit: 2015-10-13 | Discharge: 2015-10-13 | Disposition: A | Discharge status: Home / Self Care | Payer: Self-pay | Attending: Emergency Medicine | Admitting: Emergency Medicine

## 2015-10-13 DIAGNOSIS — R109 Unspecified abdominal pain: Secondary | ICD-10-CM

## 2015-10-13 DIAGNOSIS — R1111 Vomiting without nausea: Secondary | ICD-10-CM

## 2015-10-13 HISTORY — DX: Malignant (primary) neoplasm, unspecified: C80.1

## 2015-10-13 LAB — BASIC METABOLIC PANEL
ANION GAP: 7 (ref 5–15)
BUN: 15 mg/dL (ref 6–20)
CHLORIDE: 111 mmol/L (ref 101–111)
CO2: 22 mmol/L (ref 22–32)
CREATININE: 0.78 mg/dL (ref 0.44–1.00)
Calcium: 9.8 mg/dL (ref 8.9–10.3)
GFR calc non Af Amer: >60 mL/min (ref >60)
Glucose, Bld: 114 mg/dL — ABNORMAL HIGH (ref 65–99)
POTASSIUM: 3.8 mmol/L (ref 3.5–5.1)
Sodium: 140 mmol/L (ref 135–145)

## 2015-10-13 LAB — HEPATIC FUNCTION PANEL
ALBUMIN: 4.4 g/dL (ref 3.5–5.0)
ALT: 15 U/L (ref 14–54)
AST: 15 U/L (ref 15–41)
Alkaline Phosphatase: 48 U/L (ref 38–126)
Bilirubin, Direct: <0.1 mg/dL — ABNORMAL LOW (ref 0.1–0.5)
TOTAL PROTEIN: 7.3 g/dL (ref 6.5–8.1)
Total Bilirubin: 0.4 mg/dL (ref 0.3–1.2)

## 2015-10-13 LAB — LIPASE, BLOOD: Lipase: 34 U/L (ref 11–51)

## 2015-10-13 LAB — I-STAT TROPONIN, ED: Troponin i, poc: 0 ng/mL (ref 0.00–0.08)

## 2015-10-13 MED ORDER — GI COCKTAIL ~~LOC~~
30.0000 mL | Freq: Once | ORAL | Status: AC
Start: 2015-10-13 — End: 2015-10-13
  Administered 2015-10-13: 30 mL via ORAL
  Filled 2015-10-13: qty 30

## 2015-10-13 MED ORDER — ONDANSETRON 8 MG PO TBDP: ORAL_TABLET | ORAL | Status: DC

## 2015-10-13 MED ORDER — OMEPRAZOLE 20 MG PO CPDR: 20.0000 mg | DELAYED_RELEASE_CAPSULE | Freq: Every day | ORAL | Status: DC

## 2015-10-13 MED ORDER — ONDANSETRON HCL 4 MG/2ML IJ SOLN
4.0000 mg | Freq: Once | INTRAMUSCULAR | Status: AC
  Administered 2015-10-13: 4 mg via INTRAVENOUS
  Filled 2015-10-13: qty 2

## 2015-10-13 MED ORDER — IOHEXOL 300 MG/ML  SOLN
100.0000 mL | Freq: Once | INTRAMUSCULAR | Status: AC | PRN
  Administered 2015-10-13: 100 mL via INTRAVENOUS

## 2015-10-13 MED ORDER — FENTANYL CITRATE (PF) 100 MCG/2ML IJ SOLN
50.0000 ug | Freq: Once | INTRAMUSCULAR | Status: AC
  Administered 2015-10-13: 50 ug via INTRAVENOUS
  Filled 2015-10-13: qty 2

## 2015-10-13 MED ORDER — HYOSCYAMINE SULFATE 0.125 MG SL SUBL: 0.1250 mg | SUBLINGUAL_TABLET | SUBLINGUAL | Status: DC | PRN

## 2015-10-13 MED ORDER — DICYCLOMINE HCL 10 MG/ML IM SOLN
20.0000 mg | Freq: Once | INTRAMUSCULAR | Status: AC
  Administered 2015-10-13: 20 mg via INTRAMUSCULAR
  Filled 2015-10-13: qty 2

## 2015-10-13 MED ORDER — FENTANYL CITRATE (PF) 100 MCG/2ML IJ SOLN
100.0000 ug | Freq: Once | INTRAMUSCULAR | Status: AC
  Administered 2015-10-13: 100 ug via INTRAVENOUS
  Filled 2015-10-13: qty 2

## 2015-10-13 NOTE — ED Provider Notes (Signed)
CSN: [id number]     Arrival date & time 10/12/15  2320 History  By signing my name below, I, [name], attest that this documentation has been prepared under the direction and in the presence of Andrea Buffkin, MD . Electronically Signed: Evelene Barr, Scribe. 10/13/2015. 2:17 AM.      Chief Complaint  Patient presents with  . Abdominal Pain    Patient is a 44 y.o. female presenting with abdominal pain. The history is provided by the patient. No language interpreter was used.  Abdominal Pain Pain location:  RUQ and LUQ Pain quality: dull   Pain radiates to:  Does not radiate Pain severity:  Moderate Onset quality:  Gradual Duration:  2 weeks Timing:  Constant Progression:  Unchanged Context: eating   Context: not recent travel   Relieved by:  Nothing Worsened by:  Eating Ineffective treatments:  None tried Associated symptoms: nausea and vomiting   Associated symptoms: no chest pain, no chills, no constipation, no cough, no diarrhea, no dysuria, no fever, no hematuria and no shortness of breath   Risk factors: no recent hospitalization      HPI Comments:  [name] is a 45 y.o. female who presents to the Emergency Department complaining of 8/10 dull, constant, upper abdominal pain for 2 weeks with associated nausea and vomiting. Her pain is exacerbated by eating. No recent travel, recent surgery, use of BCP, and diarrhea. No alleviating factors noted.  No trauma.  Denies urinary symptoms.  Denies surgical history but has had hysterectomy.  No f/c/r.  No diarrhea or constipation.  No rashes on the skin   Past Medical History  Diagnosis Date  . Migraine   . Knee pain, bilateral   . Asthma   . Sciatica of left side   . Disc narrowing   . Chronic back pain   . Cancer [facility])    Past Surgical History  Procedure Laterality Date  . Abdominal hysterectomy    . Biopsy breast    . Pelvic exenteration     Family History  Problem Relation Age of Onset  . Diabetes  Other   . CAD Other    Social History  Substance Use Topics  . Smoking status: Current Every Day Smoker -- 0.00 packs/day    Types: Cigarettes  . Smokeless tobacco: None  . Alcohol Use: No   OB History    No data available     Review of Systems  Constitutional: Negative for fever and chills.  Respiratory: Negative for cough, chest tightness and shortness of breath.   Cardiovascular: Negative for chest pain, palpitations and leg swelling.  Gastrointestinal: Positive for nausea, vomiting and abdominal pain. Negative for diarrhea and constipation.  Genitourinary: Negative for dysuria, frequency, hematuria, flank pain, difficulty urinating and pelvic pain.  All other systems reviewed and are negative.   Allergies  Morphine and related; Prednisone; Seroquel; and Thorazine  Home Medications   Prior to Admission medications   Medication Sig Start Date End Date Taking? Authorizing Provider  acetaminophen (TYLENOL) 500 MG tablet Take 1,000 mg by mouth every 6 (six) hours as needed for mild pain.    Historical Provider, MD  albuterol (PROVENTIL HFA;VENTOLIN HFA) 108 (90 BASE) MCG/ACT inhaler Inhale 2 puffs into the lungs every 6 (six) hours as needed for wheezing or shortness of breath.    Historical Provider, MD  amitriptyline (ELAVIL) 10 MG tablet Take 50 mg by mouth at bedtime.  12/09/14   Historical Provider, MD  clonazePAM Bobbye [place]) 1  MG tablet Take 1-2 mg by mouth 2 (two) times daily. Take 1mg  in the morning and 2mg  at night.    Historical Provider, MD  furosemide (LASIX) 20 MG tablet Take 1 tablet (20 mg total) by mouth daily. 04/15/15   [name], MD  gabapentin (NEURONTIN) 300 MG capsule Take 300 mg by mouth 3 (three) times daily.    Historical Provider, MD  Multiple Vitamin (MULTIVITAMIN WITH MINERALS) TABS tablet Take 1 tablet by mouth every morning.     Historical Provider, MD  oxyCODONE-acetaminophen (PERCOCET/ROXICET) 5-325 MG per tablet Take 1-2 tablets by mouth every 8  (eight) hours as needed for severe pain. 09/26/14   Carlisle Cater, PA-C  promethazine (PHENERGAN) 25 MG suppository Place 25 mg rectally every 6 (six) hours as needed for nausea or vomiting.    Historical Provider, MD  rizatriptan (MAXALT) 10 MG tablet Take 10 mg by mouth as needed for migraine. May repeat in 2 hours if needed    Historical Provider, MD   BP 161/99 mmHg  Pulse 109  Temp(Src) 98.6 F (37 C) (Oral)  Resp 10  SpO2 100% Physical Exam  Constitutional: She is oriented to person, place, and time. She appears well-developed and well-nourished. No distress.  HENT:  Head: Normocephalic and atraumatic.  Mouth/Throat: Oropharynx is clear and moist. No oropharyngeal exudate.  Moist mucous membranes   Eyes: Conjunctivae are normal. Pupils are equal, round, and reactive to light.  Neck: Normal range of motion. Neck supple. No JVD present.  Trachea midline  Cardiovascular: Normal rate, regular rhythm and normal heart sounds.   Pulmonary/Chest: Effort normal and breath sounds normal. No respiratory distress. She has no wheezes. She has no rales.  Abdominal: Soft. She exhibits no distension and no mass. There is no tenderness. There is no rebound and no guarding.  Gassy  Musculoskeletal: Normal range of motion. She exhibits no edema.  Neurological: She is alert and oriented to person, place, and time. She has normal reflexes.  Skin: Skin is warm and dry.  Psychiatric: She has a normal mood and affect. Her behavior is normal.  Nursing note and vitals reviewed.   ED Course  Procedures   DIAGNOSTIC STUDIES:  Oxygen Saturation is 100% on RA, normal by my interpretation.    COORDINATION OF CARE:  2:05 AM Discussed treatment plan with pt at bedside and pt agreed to plan.  Labs Review Labs Reviewed  BASIC METABOLIC PANEL - Abnormal; Notable for the following:    Glucose, Bld 114 (*)    All other components within normal limits  CBC - Abnormal; Notable for the following:    WBC  13.3 (*)    All other components within normal limits  HEPATIC FUNCTION PANEL - Abnormal; Notable for the following:    Bilirubin, Direct <0.1 (*)    All other components within normal limits  LIPASE, BLOOD  I-STAT TROPOININ, ED    Imaging Review Dg Chest 2 View  10/13/2015  CLINICAL DATA:  Initial valuation for diffuse chest pain for 2 weeks. EXAM: CHEST  2 VIEW COMPARISON:  Prior study from 04/27/2009. FINDINGS: The cardiac and mediastinal silhouettes are stable in size and contour, and remain within normal limits. The lungs are normally inflated. No airspace consolidation, pleural effusion, or pulmonary edema is identified. There is no pneumothorax. Nipple shadows overlie the lower lobes bilaterally. No acute osseous abnormality identified. IMPRESSION: No active cardiopulmonary disease. Electronically Signed   By: Jeannine Boga M.D.   On: 10/13/2015 00:16  I have personally reviewed and evaluated these images and lab results as part of my medical decision-making.   EKG Interpretation   Date/Time:  Monday October 12 2015 23:33:11 EST Ventricular Rate:  116 PR Interval:  114 QRS Duration: 74 QT Interval:  306 QTC Calculation: 425 R Axis:   86 Text Interpretation:  Sinus tachycardia Confirmed by [facility]  MD,  Amreen Raczkowski ([id number]) on 10/13/2015 1:07:40 AM      MDM   Final diagnoses:  None    Medications  gi cocktail (Maalox,Lidocaine,Donnatal) (30 mLs Oral Given 10/13/15 0114)  fentaNYL (SUBLIMAZE) injection 50 mcg (50 mcg Intravenous Given 10/13/15 0225)  dicyclomine (BENTYL) injection 20 mg (20 mg Intramuscular Given 10/13/15 0224)  ondansetron (ZOFRAN) injection 4 mg (4 mg Intravenous Given 10/13/15 0225)  iohexol (OMNIPAQUE) 300 MG/ML solution 100 mL (100 mLs Intravenous Contrast Given 10/13/15 0239)  fentaNYL (SUBLIMAZE) injection 100 mcg (100 mcg Intravenous Given 10/13/15 0354)   Results for orders placed or performed during the hospital encounter of  [id number]  Basic metabolic panel  Result Value Ref Range   Sodium 140 135 - 145 mmol/L   Potassium 3.8 3.5 - 5.1 mmol/L   Chloride 111 101 - 111 mmol/L   CO2 22 22 - 32 mmol/L   Glucose, Bld 114 (H) 65 - 99 mg/dL   BUN 15 6 - 20 mg/dL   Creatinine, Ser 0.78 0.44 - 1.00 mg/dL   Calcium 9.8 8.9 - 10.3 mg/dL   GFR calc non Af Amer >60 >60 mL/min   GFR calc Af Amer >60 >60 mL/min   Anion gap 7 5 - 15  CBC  Result Value Ref Range   WBC 13.3 (H) 4.0 - 10.5 K/uL   RBC 4.48 3.87 - 5.11 MIL/uL   Hemoglobin 13.9 12.0 - 15.0 g/dL   HCT 41.9 36.0 - 46.0 %   MCV 93.5 78.0 - 100.0 fL   MCH 31.0 26.0 - 34.0 pg   MCHC 33.2 30.0 - 36.0 g/dL   RDW 13.0 11.5 - 15.5 %   Platelets 340 150 - 400 K/uL  Hepatic function panel  Result Value Ref Range   Total Protein 7.3 6.5 - 8.1 g/dL   Albumin 4.4 3.5 - 5.0 g/dL   AST 15 15 - 41 U/L   ALT 15 14 - 54 U/L   Alkaline Phosphatase 48 38 - 126 U/L   Total Bilirubin 0.4 0.3 - 1.2 mg/dL   Bilirubin, Direct <0.1 (L) 0.1 - 0.5 mg/dL   Indirect Bilirubin NOT CALCULATED 0.3 - 0.9 mg/dL  Lipase, blood  Result Value Ref Range   Lipase 34 11 - 51 U/L  I-stat troponin, ED (not at [facility], [facility])  Result Value Ref Range   Troponin i, poc 0.00 0.00 - 0.08 ng/mL   Comment 3           Dg Chest 2 View  10/13/2015  CLINICAL DATA:  Initial valuation for diffuse chest pain for 2 weeks. EXAM: CHEST  2 VIEW COMPARISON:  Prior study from 04/27/2009. FINDINGS: The cardiac and mediastinal silhouettes are stable in size and contour, and remain within normal limits. The lungs are normally inflated. No airspace consolidation, pleural effusion, or pulmonary edema is identified. There is no pneumothorax. Nipple shadows overlie the lower lobes bilaterally. No acute osseous abnormality identified. IMPRESSION: No active cardiopulmonary disease. Electronically Signed   By: Jeannine Boga M.D.   On: 10/13/2015 00:16   Ct Abdomen Pelvis W Contrast  10/13/2015  CLINICAL DATA:  Subacute onset of severe upper abdominal pain and upper back pain. Vomiting. Initial encounter. EXAM: CT ABDOMEN AND PELVIS WITH CONTRAST TECHNIQUE: Multidetector CT imaging of the abdomen and pelvis was performed using the standard protocol following bolus administration of intravenous contrast. CONTRAST:  15mL OMNIPAQUE IOHEXOL 300 MG/ML  SOLN COMPARISON:  MRI of the lumbar spine performed 08/07/2014 FINDINGS: The visualized lung bases are clear. A 7 mm hypodensity within the right hepatic lobe may reflect a small cyst. A few small cysts at the inferior aspect of the spleen measure up to 9 mm in size. The gallbladder is within normal limits. The pancreas and adrenal glands are unremarkable. The kidneys are unremarkable in appearance. There is no evidence of hydronephrosis. No renal or ureteral stones are seen. No perinephric stranding is appreciated. No free fluid is identified. The small bowel is unremarkable in appearance. The stomach is within normal limits. No acute vascular abnormalities are seen. The appendix is normal in caliber and contains air, without evidence of appendicitis. The colon is unremarkable in appearance. The bladder is mildly distended and grossly unremarkable. The patient is status post hysterectomy. No suspicious adnexal masses are seen. The left ovary is grossly unremarkable in appearance. No inguinal lymphadenopathy is seen. No acute osseous abnormalities are identified. IMPRESSION: 1. No acute abnormality seen within the abdomen or pelvis. 2. Few small splenic cysts and nonspecific 7 mm hypodensity within the right hepatic lobe, possibly reflecting a small cyst. Electronically Signed   By: Garald Balding M.D.   On: 10/13/2015 03:05    No acute finding on CT scan.  Patient has a lot of gas on exam and this can be very painful.  I suspect a component of gastritis.  There has been no vomiting in the ED.  Patient has percocet in her handbag which she states is for breakthrough migraine  pain.  Will treat for gastritis and abdominal cramping related to gas.  Follow up with your PMD for ongoing care.  Strict return precautions given.     I personally performed the services described in this documentation, which was scribed in my presence. The recorded information has been reviewed and is accurate.      Veatrice Kells, MD 10/13/15 [phone]

## 2015-10-13 NOTE — ED Notes (Signed)
Pt left with all her belongings and ambulated out of the treatment area.  

## 2015-10-13 NOTE — ED Notes (Signed)
Patient transported to CT 

## 2015-11-23 ENCOUNTER — Ambulatory Visit (INDEPENDENT_AMBULATORY_CARE_PROVIDER_SITE_OTHER): Payer: Self-pay | Admitting: Family Medicine

## 2015-11-23 ENCOUNTER — Ambulatory Visit (INDEPENDENT_AMBULATORY_CARE_PROVIDER_SITE_OTHER): Payer: Self-pay

## 2015-11-23 VITALS — BP 130/80 | HR 92 | Temp 98.0°F | Resp 17 | Ht 65.5 in | Wt 137.0 lb

## 2015-11-23 DIAGNOSIS — R1084 Generalized abdominal pain: Secondary | ICD-10-CM

## 2015-11-23 DIAGNOSIS — R059 Cough, unspecified: Secondary | ICD-10-CM

## 2015-11-23 DIAGNOSIS — R509 Fever, unspecified: Secondary | ICD-10-CM

## 2015-11-23 DIAGNOSIS — J0110 Acute frontal sinusitis, unspecified: Secondary | ICD-10-CM

## 2015-11-23 DIAGNOSIS — R05 Cough: Secondary | ICD-10-CM

## 2015-11-23 LAB — COMPREHENSIVE METABOLIC PANEL
ALBUMIN: 4.6 g/dL (ref 3.6–5.1)
ALT: 10 U/L (ref 6–29)
AST: 11 U/L (ref 10–35)
Alkaline Phosphatase: 45 U/L (ref 33–115)
BILIRUBIN TOTAL: 0.3 mg/dL (ref 0.2–1.2)
BUN: 16 mg/dL (ref 7–25)
CALCIUM: 9.8 mg/dL (ref 8.6–10.2)
CHLORIDE: 108 mmol/L (ref 98–110)
CO2: 23 mmol/L (ref 20–31)
Creat: 0.9 mg/dL (ref 0.50–1.10)
Glucose, Bld: 97 mg/dL (ref 65–99)
Potassium: 4 mmol/L (ref 3.5–5.3)
Sodium: 142 mmol/L (ref 135–146)
TOTAL PROTEIN: 6.8 g/dL (ref 6.1–8.1)

## 2015-11-23 LAB — POCT CBC
GRANULOCYTE PERCENT: 50.1 % (ref 37–80)
HEMATOCRIT: 38.9 % (ref 37.7–47.9)
Hemoglobin: 13.2 g/dL (ref 12.2–16.2)
Lymph, poc: 2.9 (ref 0.6–3.4)
MCH, POC: 30.5 pg (ref 27–31.2)
MCHC: 33.9 g/dL (ref 31.8–35.4)
MCV: 89.9 fL (ref 80–97)
MID (CBC): 0.2 (ref 0–0.9)
MPV: 7.6 fL (ref 0–99.8)
POC GRANULOCYTE: 3.2 (ref 2–6.9)
POC LYMPH %: 46 % (ref 10–50)
POC MID %: 3.9 %M (ref 0–12)
Platelet Count, POC: 314 10*3/uL (ref 142–424)
RBC: 4.33 M/uL (ref 4.04–5.48)
RDW, POC: 12.4 %
WBC: 6.4 10*3/uL (ref 4.6–10.2)

## 2015-11-23 MED ORDER — BENZONATATE 100 MG PO CAPS: 100.0000 mg | ORAL_CAPSULE | Freq: Three times a day (TID) | ORAL | Status: DC | PRN

## 2015-11-23 MED ORDER — AMOXICILLIN 500 MG PO CAPS: 1000.0000 mg | ORAL_CAPSULE | Freq: Two times a day (BID) | ORAL | Status: DC

## 2015-11-23 NOTE — Progress Notes (Signed)
[facility] [address] [place] [address] [phone]  Date:  11/23/2015   Name:  [name]   DOB:  07-23-1971   MRN:  [id number]  PCP:  No PCP Per Patient    Chief Complaint: Cough; Nasal Congestion; and URI   History of Present Illness:  [name] is a 45 y.o. very pleasant female patient who presents with the following:  History of asthma.  Here today as a new patient with concern of illness with abd pain and also URI type sx for maybe 4-6 weeks- "a while," she is not sure how long she has been sick.  According to ER note on 12/13 she had had symptoms of abd pain for 2 weeks at that time.  She had a negative CT per the ER on 12/13. She states this is the same pain now.  Her liver function, kidney function, amylase were all normal at ER visit.    She has noticed that her head hurts, her nose is congested and she is blowing out a lot of material from her nose. Her ears hurt, she has a cough, her back hurts, her right ribs hurt, her bones hurt.  She has noted these sx for approx 2 weeks, again she is not really sure She has noted vomiting for 3 weeks. States that she cannot eat, She is using ensure for nutrition.  Unsure when she last actually vomited  Her ER labs were negative except for slight leukocytosis.  She had ovarian cancer and had a total hysterectomy in 2003.   She is a smoker.    She states that her temp was "104.1" about 4 weeks ago.  Since then her temp has been "up and down" but she is not really sure when her last fever occured She is using tylenol/ ibuprofen at home  Wt Readings from Last 3 Encounters:  11/23/15 137 lb (62.143 kg)  04/14/15 140 lb 2 oz (63.56 kg)  09/25/14 131 lb (59.421 kg)   Her neurologist is Dr. Sima Matas who is treating her for Migraine- he is in [place]. She is getting klonopin from this clinic in [place] monthly   There are no active problems to display for this patient.   Past Medical History   Diagnosis Date  . Migraine   . Knee pain, bilateral   . Asthma   . Sciatica of left side   . Disc narrowing   . Chronic back pain   . Cancer [facility])     Past Surgical History  Procedure Laterality Date  . Abdominal hysterectomy    . Biopsy breast    . Pelvic exenteration      Social History  Substance Use Topics  . Smoking status: Current Every Day Smoker -- 1.00 packs/day for 14 years    Types: Cigarettes  . Smokeless tobacco: None  . Alcohol Use: No    Family History  Problem Relation Age of Onset  . Diabetes Other   . CAD Other     Allergies  Allergen Reactions  . Morphine And Related Other (See Comments)    Drops blood pressure  . Prednisone Swelling  . Seroquel [Quetiapine Fumerate] Other (See Comments)    headaches  . Thorazine [Chlorpromazine Hcl] Other (See Comments)    headaches    Medication list has been reviewed and updated.  Current Outpatient Prescriptions on File Prior to Visit  Medication Sig Dispense Refill  . acetaminophen (TYLENOL) 500 MG tablet Take 1,000 mg  by mouth every 6 (six) hours as needed for mild pain.    . Calcium Citrate-Vitamin D (CALCIUM + D PO) Take 1 tablet by mouth daily.    . clonazePAM (KLONOPIN) 1 MG tablet Take 1-2 mg by mouth 2 (two) times daily. Take 1mg  in the morning and 2mg  at night.    . gabapentin (NEURONTIN) 300 MG capsule Take 300 mg by mouth 3 (three) times daily.    . Multiple Vitamin (MULTIVITAMIN WITH MINERALS) TABS tablet Take 1 tablet by mouth every morning.     Marland Kitchen omeprazole (PRILOSEC) 20 MG capsule Take 1 capsule (20 mg total) by mouth daily. 30 capsule 0  . promethazine (PHENERGAN) 25 MG suppository Place 25 mg rectally every 6 (six) hours as needed for nausea or vomiting.    . promethazine (PHENERGAN) 25 MG tablet Take 25 mg by mouth every 6 (six) hours as needed for nausea or vomiting.    . rizatriptan (MAXALT) 10 MG tablet Take 10 mg by mouth daily as needed for migraine. May repeat in 2 hours if needed     . topiramate (TOPAMAX) 50 MG tablet Take 50 mg by mouth 3 (three) times daily.    Marland Kitchen albuterol (PROVENTIL HFA;VENTOLIN HFA) 108 (90 BASE) MCG/ACT inhaler Inhale 2 puffs into the lungs every 6 (six) hours as needed for wheezing or shortness of breath. Reported on 11/23/2015     No current facility-administered medications on file prior to visit.    Review of Systems:  As per HPI- otherwise negative.   Physical Examination: Filed Vitals:   11/23/15 1452  BP: 130/80  Pulse: 92  Temp: 98 F (36.7 C)  Resp: 17   Filed Vitals:   11/23/15 1452  Height: 5' 5.5" (1.664 m)  Weight: 137 lb (62.143 kg)   Body mass index is 22.44 kg/(m^2). Ideal Body Weight: Weight in (lb) to have BMI = 25: 152.2  GEN: WDWN, NAD, Non-toxic, A & O x 3, poor eye contact, challenging historian HEENT: Atraumatic, Normocephalic. Neck supple. No masses, No LAD.  Bilateral TM wnl, oropharynx normal.  PEERL,EOMI.   Ears and Nose: No external deformity. CV: RRR, No M/G/R. No JVD. No thrill. No extra heart sounds. PULM: CTA B, no wheezes, crackles, rhonchi. No retractions. No resp. distress. No accessory muscle use. ABD: S, ND, +BS. No rebound. No HSM.  She endorses pain all over her abdomen, most in the RUQ EXTR: No c/c/e NEURO Normal gait.  PSYCH:unusual affect; does not want to make eye contact and is very vague about her history.  However she is not psychotic and is cooperative   [facility] reading (PRIMARY) by  Dr. Lorelei Pont. CXR: negative abd series: negative  Results for orders placed or performed in visit on 11/23/15  POCT CBC  Result Value Ref Range   WBC 6.4 4.6 - 10.2 K/uL   Lymph, poc 2.9 0.6 - 3.4   POC LYMPH PERCENT 46.0 10 - 50 %L   MID (cbc) 0.2 0 - 0.9   POC MID % 3.9 0 - 12 %M   POC Granulocyte 3.2 2 - 6.9   Granulocyte percent 50.1 37 - 80 %G   RBC 4.33 4.04 - 5.48 M/uL   Hemoglobin 13.2 12.2 - 16.2 g/dL   HCT, POC 38.9 37.7 - 47.9 %   MCV 89.9 80 - 97 fL   MCH, POC 30.5 27 - 31.2 pg    MCHC 33.9 31.8 - 35.4 g/dL   RDW, POC 12.4 %  Platelet Count, POC 314 142 - 424 K/uL   MPV 7.6 0 - 99.8 fL    ABDOMEN - 2 VIEW  COMPARISON: October 13, 2015  FINDINGS: Supine and upright images obtained. There is diffuse stool throughout colon. There is no bowel dilatation or air-fluid level suggesting obstruction. No free air. No abnormal calcifications. Lung bases clear.  IMPRESSION: Diffuse stool throughout colon. No obstruction or free air. Lung bases clear.  CHEST 2 VIEW  COMPARISON: Chest radiographs 10/13/2015 and 04/27/2009.  FINDINGS: The heart size and mediastinal contours are normal. The lungs are clear. There is no pleural effusion or pneumothorax. No acute osseous findings are identified. Mildly prominent nipple shadows are again noted bilaterally.  IMPRESSION: Stable chest. No active cardiopulmonary process.  Assessment and Plan: Cough - Plan: POCT CBC, DG Chest 2 View, Comprehensive metabolic panel, benzonatate (TESSALON) 100 MG capsule  Fever, unspecified - Plan: POCT CBC, DG Chest 2 View, Comprehensive metabolic panel  Abdominal pain, generalized - Plan: POCT CBC, DG Chest 2 View, DG Abd 2 Views, Comprehensive metabolic panel, US Abdomen Limited RUQ  Acute frontal sinusitis, recurrence not specified - Plan: amoxicillin (AMOXIL) 500 MG capsule  Here today with complaint of 2 different issues- sinus infection symptoms and abd symptoms. Will treat her with amoxicillin and tessalon for her sinusitis and cough Although she has complaint of dramatic GI symptoms they have been going on for over a month so doubt any acutely dangerous pathology.  Also normal CBC today. Recent CT was negative.  Will arrange for an RUQ [place] to rule- out any gallbladder pathology.  She has phenergan that she can use for nausea- it is unclear if she is taking this but reminded her that she could use this for nausea if needed   Signed Lamar Blinks, MD

## 2015-11-23 NOTE — Patient Instructions (Addendum)
Because you received an x-ray today, you will receive an invoice from [facility]. Please contact [facility] at [phone] with questions or concerns regarding your invoice. Our billing staff will not be able to assist you with those questions.   We are going to treat you for a sinus infection with amoxicillin Also tessalon perles for cough- use these as needed We will also arrange for an ultrasound of your gallbladder to make sure that you do not have gallbladder disease We will set this up and will be in touch with you asap If you are getting worse or have a change in your symptoms please seek care with [place] or the ER

## 2015-11-24 ENCOUNTER — Encounter: Payer: Self-pay | Admitting: Family Medicine

## 2016-04-01 ENCOUNTER — Encounter (HOSPITAL_COMMUNITY): Payer: Self-pay | Admitting: Oncology

## 2016-04-01 ENCOUNTER — Ambulatory Visit (INDEPENDENT_AMBULATORY_CARE_PROVIDER_SITE_OTHER): Payer: Self-pay | Admitting: Physician Assistant

## 2016-04-01 ENCOUNTER — Encounter: Payer: Self-pay | Admitting: Physician Assistant

## 2016-04-01 ENCOUNTER — Emergency Department (HOSPITAL_COMMUNITY)
Admission: EM | Admit: 2016-04-01 | Discharge: 2016-04-01 | Disposition: A | Discharge status: Home / Self Care | Payer: No Typology Code available for payment source | Attending: Emergency Medicine | Admitting: Emergency Medicine

## 2016-04-01 ENCOUNTER — Emergency Department (HOSPITAL_COMMUNITY): Payer: No Typology Code available for payment source

## 2016-04-01 VITALS — BP 118/74 | HR 87 | Temp 98.1°F | Resp 17 | Ht 65.5 in | Wt 138.0 lb

## 2016-04-01 DIAGNOSIS — R208 Other disturbances of skin sensation: Secondary | ICD-10-CM

## 2016-04-01 DIAGNOSIS — G43009 Migraine without aura, not intractable, without status migrainosus: Secondary | ICD-10-CM | POA: Insufficient documentation

## 2016-04-01 DIAGNOSIS — Y999 Unspecified external cause status: Secondary | ICD-10-CM | POA: Insufficient documentation

## 2016-04-01 DIAGNOSIS — Y939 Activity, unspecified: Secondary | ICD-10-CM | POA: Insufficient documentation

## 2016-04-01 DIAGNOSIS — T07XXXA Unspecified multiple injuries, initial encounter: Secondary | ICD-10-CM

## 2016-04-01 DIAGNOSIS — S7012XA Contusion of left thigh, initial encounter: Secondary | ICD-10-CM | POA: Insufficient documentation

## 2016-04-01 DIAGNOSIS — M79605 Pain in left leg: Secondary | ICD-10-CM

## 2016-04-01 DIAGNOSIS — M545 Low back pain, unspecified: Secondary | ICD-10-CM

## 2016-04-01 DIAGNOSIS — F17210 Nicotine dependence, cigarettes, uncomplicated: Secondary | ICD-10-CM | POA: Insufficient documentation

## 2016-04-01 DIAGNOSIS — R10815 Periumbilic abdominal tenderness: Secondary | ICD-10-CM | POA: Insufficient documentation

## 2016-04-01 DIAGNOSIS — Y92410 Unspecified street and highway as the place of occurrence of the external cause: Secondary | ICD-10-CM | POA: Insufficient documentation

## 2016-04-01 DIAGNOSIS — J45909 Unspecified asthma, uncomplicated: Secondary | ICD-10-CM | POA: Diagnosis not present

## 2016-04-01 DIAGNOSIS — S79922A Unspecified injury of left thigh, initial encounter: Secondary | ICD-10-CM | POA: Diagnosis present

## 2016-04-01 DIAGNOSIS — S8012XA Contusion of left lower leg, initial encounter: Secondary | ICD-10-CM

## 2016-04-01 LAB — CBC WITH DIFFERENTIAL/PLATELET
BASOS ABS: 0 10*3/uL (ref 0.0–0.1)
Basophils Relative: 0 %
EOS ABS: 0.3 10*3/uL (ref 0.0–0.7)
EOS PCT: 5 %
HCT: 37.3 % (ref 36.0–46.0)
Hemoglobin: 12.3 g/dL (ref 12.0–15.0)
LYMPHS PCT: 50 %
Lymphs Abs: 2.9 10*3/uL (ref 0.7–4.0)
MCH: 30.4 pg (ref 26.0–34.0)
MCHC: 33 g/dL (ref 30.0–36.0)
MCV: 92.1 fL (ref 78.0–100.0)
Monocytes Absolute: 0.4 10*3/uL (ref 0.1–1.0)
Monocytes Relative: 6 %
NEUTROS PCT: 39 %
Neutro Abs: 2.3 10*3/uL (ref 1.7–7.7)
PLATELETS: 277 10*3/uL (ref 150–400)
RBC: 4.05 MIL/uL (ref 3.87–5.11)
RDW: 13.1 % (ref 11.5–15.5)
WBC: 6 10*3/uL (ref 4.0–10.5)

## 2016-04-01 LAB — I-STAT CHEM 8, ED
BUN: 23 mg/dL — ABNORMAL HIGH (ref 6–20)
CHLORIDE: 105 mmol/L (ref 101–111)
Calcium, Ion: 1.31 mmol/L — ABNORMAL HIGH (ref 1.12–1.23)
Creatinine, Ser: 0.9 mg/dL (ref 0.44–1.00)
Glucose, Bld: 76 mg/dL (ref 65–99)
HCT: 38 % (ref 36.0–46.0)
Hemoglobin: 12.9 g/dL (ref 12.0–15.0)
POTASSIUM: 3.9 mmol/L (ref 3.5–5.1)
SODIUM: 142 mmol/L (ref 135–145)
TCO2: 25 mmol/L (ref 0–100)

## 2016-04-01 LAB — URINALYSIS, ROUTINE W REFLEX MICROSCOPIC
BILIRUBIN URINE: NEGATIVE
Glucose, UA: NEGATIVE mg/dL
Hgb urine dipstick: NEGATIVE
KETONES UR: NEGATIVE mg/dL
Leukocytes, UA: NEGATIVE
NITRITE: NEGATIVE
PH: 6 (ref 5.0–8.0)
PROTEIN: NEGATIVE mg/dL
Specific Gravity, Urine: 1.01 (ref 1.005–1.030)

## 2016-04-01 MED ORDER — FENTANYL CITRATE (PF) 100 MCG/2ML IJ SOLN
100.0000 ug | Freq: Once | INTRAMUSCULAR | Status: AC
  Administered 2016-04-01: 100 ug via INTRAVENOUS
  Filled 2016-04-01: qty 2

## 2016-04-01 MED ORDER — IOPAMIDOL (ISOVUE-300) INJECTION 61%
100.0000 mL | Freq: Once | INTRAVENOUS | Status: AC | PRN
  Administered 2016-04-01: 100 mL via INTRAVENOUS

## 2016-04-01 NOTE — ED Notes (Signed)
Patient aware of need for urine. 
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
## 2016-04-01 NOTE — Discharge Instructions (Signed)
Crutch Use Continue to use ibuprofen as directed for pain. Call Dr.Landau to schedule an office visit if you continue to have significant pain in your leg or difficulty walking in a week. Call the [facility] to get a primary care physician. The wellness Center can also see you for contusions. Crutches are used to take weight off one of your legs or feet when you stand or walk. It is important to use crutches that fit properly. When fitted properly:  Each crutch should be 2-3 finger widths below the armpit.  Your weight should be supported by your hand, and not by resting the armpit on the crutch. RISKS AND COMPLICATIONS Damage to the nerves that extend from your armpit to your hand and arm. To prevent this from happening, make sure your crutches fit properly and do not put pressure on your armpit when using them. HOW TO USE YOUR CRUTCHES If you have been instructed to use partial weight bearing, apply (bear) the amount of weight as your health care provider suggests. Do not bear weight in an amount that causes pain to the area of injury. Walking 1. Step with the crutches. 2. Swing the healthy leg slightly ahead of the crutches. Going Up Steps If there is no handrail: 1. Step up with the healthy leg. 2. Step up with the crutches and injured leg. 3. Continue in this way. If there is a handrail: 1. Hold both crutches in one hand. 2. Place your free hand on the handrail. 3. While putting your weight on your arms, lift your healthy leg to the step. 4. Bring the crutches and the injured leg up to that step. 5. Continue in this way. Going Down Steps Be very careful, as going down stairs with crutches is very challenging. If there is no handrail: 1. Step down with the injured leg and crutches. 2. Step down with the healthy leg. If there is a handrail: 1. Place your hand on the handrail. 2. Hold both crutches with your free hand. 3. Lower your injured leg and  crutch to the step below you. Make sure to keep the crutch tips in the center of the step, never on the edge. 4. Lower your healthy leg to that step. 5. Continue in this way. Standing Up 1. Hold the injured leg forward. 2. Grab the armrest with one hand and the top of the crutches with the other hand. 3. Using these supports, pull yourself up to a standing position. Sitting Down 1. Hold the injured leg forward. 2. Grab the armrest with one hand and the top of the crutches with the other hand. 3. Lower yourself to a sitting position. SEEK MEDICAL CARE IF:  You still feel unsteady on your feet.  You develop new pain, for example in your armpits, back, shoulder, wrist, or hip.  You develop any numbness or tingling. SEEK IMMEDIATE MEDICAL CARE IF:  You fall.   This information is not intended to replace advice given to you by your health care provider. Make sure you discuss any questions you have with your health care provider.   Document Released: 10/14/2000 Document Revised: 11/07/2014 Document Reviewed: 06/24/2013 Elsevier Interactive Patient Education 2016 [place] A contusion is a deep bruise. Contusions happen when an injury causes bleeding under the skin. Symptoms of bruising include pain, swelling, and discolored skin. The skin may turn blue, purple, or yellow. HOME CARE   Rest the injured area.  If told, put ice on the  injured area.  Put ice in a plastic bag.  Place a towel between your skin and the bag.  Leave the ice on for 20 minutes, 2-3 times per day.  If told, put light pressure (compression) on the injured area using an elastic bandage. Make sure the bandage is not too tight. Remove it and put it back on as told by your doctor.  If possible, raise (elevate) the injured area above the level of your heart while you are sitting or lying down.  Take over-the-counter and prescription medicines only as told by your doctor. GET HELP IF: 3. Your  symptoms do not get better after several days of treatment. 4. Your symptoms get worse. 5. You have trouble moving the injured area. GET HELP RIGHT AWAY IF:  4. You have very bad pain. 5. You have a loss of feeling (numbness) in a hand or foot. 6. Your hand or foot turns pale or cold.   This information is not intended to replace advice given to you by your health care provider. Make sure you discuss any questions you have with your health care provider.   Document Released: 04/04/2008 Document Revised: 07/08/2015 Document Reviewed: 03/04/2015 Elsevier Interactive Patient Education [employer].

## 2016-04-01 NOTE — ED Notes (Signed)
Patient transported to CT 

## 2016-04-01 NOTE — Patient Instructions (Signed)
     IF you received an x-ray today, you will receive an invoice from [facility]. Please contact [facility] at [phone] with questions or concerns regarding your invoice.   IF you received labwork today, you will receive an invoice from [facility]. Please contact [facility] at [phone] with questions or concerns regarding your invoice.   Our billing staff will not be able to assist you with questions regarding bills from these companies.  You will be contacted with the lab results as soon as they are available. The fastest way to get your results is to activate your My Chart account. Instructions are located on the last page of this paperwork. If you have not heard from us regarding the results in 2 weeks, please contact this office.      
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
## 2016-04-01 NOTE — ED Notes (Signed)
Patient d/c'd self care.  F/U reviewed with patient.  Patient verbalized understanding. 

## 2016-04-01 NOTE — ED Notes (Signed)
Requested patient to urinate. 

## 2016-04-01 NOTE — ED Notes (Signed)
RN starting IV, drawing labs 

## 2016-04-11 ENCOUNTER — Ambulatory Visit (INDEPENDENT_AMBULATORY_CARE_PROVIDER_SITE_OTHER): Payer: Self-pay | Admitting: Internal Medicine

## 2016-04-11 VITALS — BP 126/70 | HR 81 | Temp 98.2°F | Resp 16 | Ht 65.0 in | Wt 139.0 lb

## 2016-04-11 DIAGNOSIS — M25562 Pain in left knee: Secondary | ICD-10-CM

## 2016-04-11 DIAGNOSIS — M5442 Lumbago with sciatica, left side: Secondary | ICD-10-CM

## 2016-04-11 MED ORDER — CYCLOBENZAPRINE HCL 10 MG PO TABS: 10.0000 mg | ORAL_TABLET | Freq: Three times a day (TID) | ORAL | Status: DC | PRN

## 2016-04-11 MED ORDER — MELOXICAM 15 MG PO TABS: 15.0000 mg | ORAL_TABLET | Freq: Every day | ORAL | Status: DC

## 2016-04-11 MED ORDER — HYDROCODONE-ACETAMINOPHEN 5-325 MG PO TABS
1.0000 | ORAL_TABLET | Freq: Four times a day (QID) | ORAL | Status: DC | PRN
Start: 2016-04-11 — End: 2016-06-15

## 2016-04-11 NOTE — Patient Instructions (Signed)
     IF you received an x-ray today, you will receive an invoice from [facility]. Please contact [facility] at [phone] with questions or concerns regarding your invoice.   IF you received labwork today, you will receive an invoice from [facility]. Please contact [facility] at [phone] with questions or concerns regarding your invoice.   Our billing staff will not be able to assist you with questions regarding bills from these companies.  You will be contacted with the lab results as soon as they are available. The fastest way to get your results is to activate your My Chart account. Instructions are located on the last page of this paperwork. If you have not heard from us regarding the results in 2 weeks, please contact this office.     We recommend that you schedule a mammogram for breast cancer screening. Typically, you do not need a referral to do this. Please contact a local imaging center to schedule your mammogram.  [facility] - [phone]  *ask for the Radiology Department [facility] - [phone] or [phone]  [facility] - [phone] [facility] - [phone] [facility]  - [phone]  *ask for the Radiology Department [facility] - [phone]  *ask for the Radiology Department [facility] - [phone]  *ask for the Mammography Department [facility] - [phone]  
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
## 2016-04-15 DIAGNOSIS — J4520 Mild intermittent asthma, uncomplicated: Secondary | ICD-10-CM | POA: Insufficient documentation

## 2016-04-18 ENCOUNTER — Ambulatory Visit: Payer: Self-pay

## 2016-04-21 ENCOUNTER — Other Ambulatory Visit (HOSPITAL_COMMUNITY): Payer: Self-pay | Admitting: Sports Medicine

## 2016-04-21 ENCOUNTER — Telehealth: Payer: Self-pay

## 2016-04-21 DIAGNOSIS — S39012A Strain of muscle, fascia and tendon of lower back, initial encounter: Secondary | ICD-10-CM

## 2016-04-21 NOTE — Telephone Encounter (Signed)
Pt has questions about her ct scan and what she saw on the results about her liver  Best number [phone]

## 2016-04-22 NOTE — Telephone Encounter (Signed)
Please review CT results per patient's questions about her liver.  Thanks.

## 2016-04-23 NOTE — Telephone Encounter (Signed)
She should return to the clinic for review of this.  As well as this follow up.  OR she can return to her pcp if she has one.

## 2016-04-23 NOTE — Telephone Encounter (Signed)
Left vm for patient to callback and get above message.

## 2016-04-25 ENCOUNTER — Ambulatory Visit (HOSPITAL_COMMUNITY)
Admission: RE | Admit: 2016-04-25 | Discharge: 2016-04-25 | Disposition: A | Discharge status: Home / Self Care | Payer: No Typology Code available for payment source | Source: Ambulatory Visit | Attending: Sports Medicine | Admitting: Sports Medicine

## 2016-04-25 DIAGNOSIS — X58XXXA Exposure to other specified factors, initial encounter: Secondary | ICD-10-CM | POA: Insufficient documentation

## 2016-04-25 DIAGNOSIS — S39012A Strain of muscle, fascia and tendon of lower back, initial encounter: Secondary | ICD-10-CM

## 2016-04-25 DIAGNOSIS — S29012A Strain of muscle and tendon of back wall of thorax, initial encounter: Secondary | ICD-10-CM | POA: Insufficient documentation

## 2016-04-25 DIAGNOSIS — M5124 Other intervertebral disc displacement, thoracic region: Secondary | ICD-10-CM | POA: Insufficient documentation

## 2016-04-27 ENCOUNTER — Other Ambulatory Visit (HOSPITAL_COMMUNITY): Payer: Self-pay | Admitting: Sports Medicine

## 2016-04-27 DIAGNOSIS — M545 Low back pain: Secondary | ICD-10-CM

## 2016-05-05 ENCOUNTER — Ambulatory Visit (INDEPENDENT_AMBULATORY_CARE_PROVIDER_SITE_OTHER): Payer: Self-pay | Admitting: Internal Medicine

## 2016-05-05 ENCOUNTER — Ambulatory Visit (HOSPITAL_COMMUNITY)
Admission: RE | Admit: 2016-05-05 | Discharge: 2016-05-05 | Disposition: A | Discharge status: Home / Self Care | Payer: No Typology Code available for payment source | Source: Ambulatory Visit | Attending: Sports Medicine | Admitting: Sports Medicine

## 2016-05-05 VITALS — BP 113/80 | HR 98 | Temp 98.2°F | Resp 18 | Ht 65.0 in | Wt 141.0 lb

## 2016-05-05 DIAGNOSIS — M545 Low back pain: Secondary | ICD-10-CM

## 2016-05-05 DIAGNOSIS — M542 Cervicalgia: Secondary | ICD-10-CM

## 2016-05-05 DIAGNOSIS — R202 Paresthesia of skin: Secondary | ICD-10-CM

## 2016-05-05 DIAGNOSIS — M546 Pain in thoracic spine: Secondary | ICD-10-CM

## 2016-05-05 DIAGNOSIS — M5126 Other intervertebral disc displacement, lumbar region: Secondary | ICD-10-CM | POA: Insufficient documentation

## 2016-05-05 DIAGNOSIS — M25552 Pain in left hip: Secondary | ICD-10-CM

## 2016-05-05 DIAGNOSIS — M5127 Other intervertebral disc displacement, lumbosacral region: Secondary | ICD-10-CM | POA: Insufficient documentation

## 2016-05-05 MED ORDER — PREDNISONE 20 MG PO TABS: ORAL_TABLET | ORAL | Status: DC

## 2016-05-05 MED ORDER — CYCLOBENZAPRINE HCL 10 MG PO TABS: 10.0000 mg | ORAL_TABLET | Freq: Three times a day (TID) | ORAL | Status: DC | PRN

## 2016-05-05 MED ORDER — OXYCODONE-ACETAMINOPHEN 7.5-325 MG PO TABS
1.0000 | ORAL_TABLET | Freq: Three times a day (TID) | ORAL | Status: DC | PRN
Start: 2016-05-05 — End: 2016-06-09

## 2016-05-05 MED ORDER — MELOXICAM 15 MG PO TABS: 15.0000 mg | ORAL_TABLET | Freq: Every day | ORAL | Status: DC

## 2016-05-05 NOTE — Patient Instructions (Addendum)
     IF you received an x-ray today, you will receive an invoice from [facility]. Please contact [facility] at [phone] with questions or concerns regarding your invoice.   IF you received labwork today, you will receive an invoice from [employer]. Please contact [facility] at [phone] with questions or concerns regarding your invoice.   Our billing staff will not be able to assist you with questions regarding bills from these companies.  You will be contacted with the lab results as soon as they are available. The fastest way to get your results is to activate your My Chart account. Instructions are located on the last page of this paperwork. If you have not heard from [place] regarding the results in 2 weeks, please contact this office.     F/u contact here Harrison Mons PA-C and you may call to see when she is on schedule
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
## 2016-05-29 ENCOUNTER — Encounter: Payer: Self-pay | Admitting: Physician Assistant

## 2016-05-29 DIAGNOSIS — M545 Low back pain, unspecified: Secondary | ICD-10-CM | POA: Insufficient documentation

## 2016-05-30 ENCOUNTER — Encounter: Payer: Self-pay | Admitting: Physical Therapy

## 2016-05-30 ENCOUNTER — Ambulatory Visit: Payer: No Typology Code available for payment source | Attending: Sports Medicine | Admitting: Physical Therapy

## 2016-05-30 DIAGNOSIS — M542 Cervicalgia: Secondary | ICD-10-CM

## 2016-05-30 DIAGNOSIS — M5442 Lumbago with sciatica, left side: Secondary | ICD-10-CM | POA: Diagnosis present

## 2016-05-30 DIAGNOSIS — M62838 Other muscle spasm: Secondary | ICD-10-CM

## 2016-05-30 DIAGNOSIS — M6281 Muscle weakness (generalized): Secondary | ICD-10-CM | POA: Insufficient documentation

## 2016-05-30 DIAGNOSIS — M546 Pain in thoracic spine: Secondary | ICD-10-CM | POA: Diagnosis not present
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
## 2016-06-01 ENCOUNTER — Ambulatory Visit: Payer: Self-pay | Admitting: Physical Therapy

## 2016-06-06 ENCOUNTER — Ambulatory Visit: Payer: Self-pay | Attending: Sports Medicine | Admitting: Physical Therapy

## 2016-06-09 ENCOUNTER — Ambulatory Visit (INDEPENDENT_AMBULATORY_CARE_PROVIDER_SITE_OTHER): Payer: Self-pay | Admitting: Family Medicine

## 2016-06-09 ENCOUNTER — Ambulatory Visit: Payer: Self-pay | Admitting: Physical Therapy

## 2016-06-09 VITALS — BP 126/84 | HR 99 | Temp 98.1°F | Resp 18

## 2016-06-09 DIAGNOSIS — M545 Low back pain: Secondary | ICD-10-CM

## 2016-06-09 DIAGNOSIS — IMO0002 Reserved for concepts with insufficient information to code with codable children: Secondary | ICD-10-CM

## 2016-06-09 DIAGNOSIS — F329 Major depressive disorder, single episode, unspecified: Secondary | ICD-10-CM

## 2016-06-09 DIAGNOSIS — Z9149 Other personal history of psychological trauma, not elsewhere classified: Secondary | ICD-10-CM

## 2016-06-09 DIAGNOSIS — G8929 Other chronic pain: Secondary | ICD-10-CM

## 2016-06-09 DIAGNOSIS — F32A Depression, unspecified: Secondary | ICD-10-CM

## 2016-06-09 MED ORDER — CYCLOBENZAPRINE HCL 10 MG PO TABS: 10.0000 mg | ORAL_TABLET | Freq: Three times a day (TID) | ORAL | 0 refills | Status: DC | PRN

## 2016-06-09 MED ORDER — OXYCODONE-ACETAMINOPHEN 10-325 MG PO TABS
1.0000 | ORAL_TABLET | ORAL | 0 refills | Status: DC | PRN
Start: 2016-06-09 — End: 2016-06-15

## 2016-06-09 MED ORDER — KETOROLAC TROMETHAMINE 60 MG/2ML IM SOLN
60.0000 mg | Freq: Once | INTRAMUSCULAR | Status: AC
  Administered 2016-06-09: 60 mg via INTRAMUSCULAR

## 2016-06-09 MED ORDER — MELOXICAM 15 MG PO TABS: 15.0000 mg | ORAL_TABLET | Freq: Every day | ORAL | 0 refills | Status: DC

## 2016-06-09 NOTE — Patient Instructions (Addendum)
I have put in the referral to a separate Orthopedist and Education officer, museum.  They will both reach out to you.  Refill for the Percocet today. Also Muscle relaxer.  It was good to see you today    IF you received an x-ray today, you will receive an invoice from [facility]. Please contact [facility] at [phone] with questions or concerns regarding your invoice.   IF you received labwork today, you will receive an invoice from [employer]. Please contact [facility] at [phone] with questions or concerns regarding your invoice.   Our billing staff will not be able to assist you with questions regarding bills from these companies.  You will be contacted with the lab results as soon as they are available. The fastest way to get your results is to activate your My Chart account. Instructions are located on the last page of this paperwork. If you have not heard from [place] regarding the results in 2 weeks, please contact this office.
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
## 2016-06-13 ENCOUNTER — Ambulatory Visit: Payer: Self-pay | Admitting: Physical Therapy

## 2016-06-13 ENCOUNTER — Telehealth: Payer: Self-pay

## 2016-06-13 DIAGNOSIS — M5440 Lumbago with sciatica, unspecified side: Principal | ICD-10-CM

## 2016-06-13 DIAGNOSIS — G8929 Other chronic pain: Secondary | ICD-10-CM

## 2016-06-13 NOTE — Telephone Encounter (Signed)
Pt needs a refill on her percocet [phone].  Says she has to leave to go out of town Wednesday and needs this before then.

## 2016-06-13 NOTE — Telephone Encounter (Signed)
Assessment and Plan:  1.  Back pain: - she is starting to reach chronicity of her back pain. - she is continuing to attend PT.  Has appt this PM.   - States that prior pain medications were helpful, but still in pain. Asking for increase today.  - Prescription for Percocet today.  I did not note degree of prior medications prescribed to her until she had left.  If she continues to require pain medication, may need referral to pain management versus establishment of pain contract.  - Refill for Meloxicam and Flexeril for pain relief.  Continue heating pads.  - Referral to [place] for second opinion.    Entire visit 30 minutes of face time.

## 2016-06-14 NOTE — Telephone Encounter (Signed)
Will route to PA pool in Dr Cindra Presume absence this week.

## 2016-06-14 NOTE — Telephone Encounter (Signed)
She should not need a refill of her Percocet that I can tell.  She was prescribed twenty-five just last week.  Thanks, JW

## 2016-06-14 NOTE — Telephone Encounter (Signed)
Dr Mingo Amber can you address this - if you agree that the patient is ok for a refill I am happy to write for her to pickup at [facility] but I think you need to make this decision

## 2016-06-15 ENCOUNTER — Ambulatory Visit (INDEPENDENT_AMBULATORY_CARE_PROVIDER_SITE_OTHER): Payer: Self-pay | Admitting: Physician Assistant

## 2016-06-15 ENCOUNTER — Encounter: Payer: Self-pay | Admitting: Physical Therapy

## 2016-06-15 VITALS — BP 126/86 | HR 93 | Temp 98.4°F | Resp 16 | Ht 65.0 in | Wt 140.6 lb

## 2016-06-15 DIAGNOSIS — R1030 Lower abdominal pain, unspecified: Secondary | ICD-10-CM

## 2016-06-15 DIAGNOSIS — M5441 Lumbago with sciatica, right side: Secondary | ICD-10-CM

## 2016-06-15 DIAGNOSIS — M5442 Lumbago with sciatica, left side: Secondary | ICD-10-CM

## 2016-06-15 LAB — POCT URINALYSIS DIP (MANUAL ENTRY)
BILIRUBIN UA: NEGATIVE
Blood, UA: NEGATIVE
Glucose, UA: NEGATIVE
Ketones, POC UA: NEGATIVE
LEUKOCYTES UA: NEGATIVE
NITRITE UA: NEGATIVE
PH UA: 5.5
Protein Ur, POC: NEGATIVE
Spec Grav, UA: <=1.005
Urobilinogen, UA: 0.2

## 2016-06-15 LAB — POC MICROSCOPIC URINALYSIS (UMFC): Mucus: ABSENT

## 2016-06-15 MED ORDER — PREDNISONE 20 MG PO TABS: ORAL_TABLET | ORAL | 0 refills | Status: AC

## 2016-06-15 MED ORDER — HYDROCODONE-ACETAMINOPHEN 10-325 MG PO TABS: 1.0000 | ORAL_TABLET | Freq: Four times a day (QID) | ORAL | 0 refills | Status: DC | PRN

## 2016-06-15 MED ORDER — OXYCODONE-ACETAMINOPHEN 10-325 MG PO TABS
1.0000 | ORAL_TABLET | Freq: Four times a day (QID) | ORAL | 0 refills | Status: DC | PRN
Start: 2016-06-15 — End: 2016-10-29

## 2016-06-15 MED ORDER — HYDROCODONE-ACETAMINOPHEN 10-325 MG PO TABS: 1.0000 | ORAL_TABLET | Freq: Four times a day (QID) | ORAL | 0 refills | Status: AC | PRN

## 2016-06-15 NOTE — Patient Instructions (Addendum)
Please ice the area three times per day for 15 minutes.  I will see if we can get this appointment with ortho asap.  It is important that you follow up with them.     IF you received an x-ray today, you will receive an invoice from [facility]. Please contact [facility] at [phone] with questions or concerns regarding your invoice.   IF you received labwork today, you will receive an invoice from [employer]. Please contact [facility] at [phone] with questions or concerns regarding your invoice.   Our billing staff will not be able to assist you with questions regarding bills from these companies.  You will be contacted with the lab results as soon as they are available. The fastest way to get your results is to activate your My Chart account. Instructions are located on the last page of this paperwork. If you have not heard from [place] regarding the results in 2 weeks, please contact this office.
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
## 2016-06-15 NOTE — Telephone Encounter (Signed)
I read pt the message, she says she really needs this to tolerate the pain. She took one pill every 4 hours and would like a new script. Please advise at [phone]
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
## 2016-06-16 NOTE — Telephone Encounter (Signed)
I have submitted the referral to Dr Ronnald Ramp' office, per the patient's request.  She should hear from them after the provider reviews her records.
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
## 2016-06-20 ENCOUNTER — Encounter: Payer: Self-pay | Admitting: Physical Therapy

## 2016-06-22 ENCOUNTER — Encounter: Payer: Self-pay | Admitting: Physical Therapy

## 2016-06-27 ENCOUNTER — Encounter: Payer: Self-pay | Admitting: Physical Therapy

## 2016-06-29 ENCOUNTER — Telehealth: Payer: Self-pay

## 2016-06-29 ENCOUNTER — Encounter: Payer: Self-pay | Admitting: Physical Therapy

## 2016-06-29 NOTE — Telephone Encounter (Signed)
Pt would like a refill on her oxyCODONE-acetaminophen (PERCOCET) 10-325 MG tablet [id number]. She has an appointment with Dr. Ronnald Ramp 07/11/16. Please advise at  [phone]

## 2016-07-01 ENCOUNTER — Telehealth: Payer: Self-pay

## 2016-07-01 NOTE — Telephone Encounter (Signed)
Patient called on 06/29/2016 requesting a refill of Oxycodone 10-325 MG. Patient is going out of town today. Please call patient at [phone]

## 2016-07-01 NOTE — Telephone Encounter (Signed)
Please f/u

## 2016-07-05 NOTE — Telephone Encounter (Signed)
Please f/u Last seen 8/16

## 2016-07-06 NOTE — Telephone Encounter (Signed)
Its here waiting for her.  Please contact her

## 2016-07-11 ENCOUNTER — Telehealth: Payer: Self-pay

## 2016-07-11 NOTE — Telephone Encounter (Signed)
Patient is calling for [place].   Patient states she's in a lot of pain and the pain medication most recently prescribed isn't working. Please advise! [phone]

## 2016-07-11 NOTE — Telephone Encounter (Signed)
Attempted to call pt to see if she picked up her prescription. No answer no voice mail

## 2016-07-11 NOTE — Telephone Encounter (Signed)
Pt p/up Rx on 9/8

## 2016-07-11 NOTE — Telephone Encounter (Signed)
She should go to the ed.

## 2016-07-12 ENCOUNTER — Telehealth: Payer: Self-pay | Admitting: Emergency Medicine

## 2016-07-12 NOTE — Telephone Encounter (Signed)
Left message to return call 
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
## 2016-07-15 NOTE — Telephone Encounter (Signed)
Called pt  And left detailed message to either go to the ED or come in and see someone here since stephanie is not here today/

## 2016-07-15 NOTE — Telephone Encounter (Signed)
In regards to message of the Seen by Dr. Ronnald Ramp.

## 2016-07-16 ENCOUNTER — Encounter (HOSPITAL_COMMUNITY): Payer: Self-pay | Admitting: Nurse Practitioner

## 2016-07-16 ENCOUNTER — Emergency Department (HOSPITAL_COMMUNITY)
Admission: EM | Admit: 2016-07-16 | Discharge: 2016-07-16 | Disposition: A | Discharge status: Home / Self Care | Payer: No Typology Code available for payment source | Attending: Emergency Medicine | Admitting: Emergency Medicine

## 2016-07-16 DIAGNOSIS — F17210 Nicotine dependence, cigarettes, uncomplicated: Secondary | ICD-10-CM | POA: Insufficient documentation

## 2016-07-16 DIAGNOSIS — Z859 Personal history of malignant neoplasm, unspecified: Secondary | ICD-10-CM | POA: Insufficient documentation

## 2016-07-16 DIAGNOSIS — G8929 Other chronic pain: Secondary | ICD-10-CM | POA: Insufficient documentation

## 2016-07-16 DIAGNOSIS — Y939 Activity, unspecified: Secondary | ICD-10-CM | POA: Diagnosis not present

## 2016-07-16 DIAGNOSIS — Y999 Unspecified external cause status: Secondary | ICD-10-CM | POA: Diagnosis not present

## 2016-07-16 DIAGNOSIS — J45909 Unspecified asthma, uncomplicated: Secondary | ICD-10-CM | POA: Insufficient documentation

## 2016-07-16 DIAGNOSIS — M549 Dorsalgia, unspecified: Secondary | ICD-10-CM | POA: Insufficient documentation

## 2016-07-16 DIAGNOSIS — Y92410 Unspecified street and highway as the place of occurrence of the external cause: Secondary | ICD-10-CM | POA: Insufficient documentation

## 2016-07-16 MED ORDER — LIDOCAINE 5 % EX PTCH: 1.0000 | MEDICATED_PATCH | CUTANEOUS | 0 refills | Status: DC

## 2016-07-16 MED ORDER — HYDROMORPHONE HCL 1 MG/ML IJ SOLN
2.0000 mg | Freq: Once | INTRAMUSCULAR | Status: AC
  Administered 2016-07-16: 2 mg via INTRAMUSCULAR
  Filled 2016-07-16: qty 2

## 2016-07-16 MED ORDER — CYCLOBENZAPRINE HCL 10 MG PO TABS: 10.0000 mg | ORAL_TABLET | Freq: Three times a day (TID) | ORAL | 0 refills | Status: DC | PRN

## 2016-07-16 NOTE — ED Notes (Signed)
Pt st;s no relief from pain med.  Rates pain #10 on pain scale 0/10.

## 2016-07-16 NOTE — Discharge Instructions (Signed)
Read the information below.  Use the prescribed medication as directed.  Please discuss all new medications with your pharmacist.  You may return to the Emergency Department at any time for worsening condition or any new symptoms that concern you.    If you develop fevers, loss of control of bowel or bladder, weakness or numbness in your legs, or are unable to walk, return to the ER for a recheck.  °
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
## 2016-07-18 ENCOUNTER — Ambulatory Visit (INDEPENDENT_AMBULATORY_CARE_PROVIDER_SITE_OTHER): Payer: Self-pay | Admitting: Emergency Medicine

## 2016-07-18 ENCOUNTER — Telehealth: Payer: Self-pay

## 2016-07-18 ENCOUNTER — Encounter: Payer: Self-pay | Admitting: Emergency Medicine

## 2016-07-18 DIAGNOSIS — R52 Pain, unspecified: Secondary | ICD-10-CM

## 2016-07-18 MED ORDER — IBUPROFEN 600 MG PO TABS
600.0000 mg | ORAL_TABLET | Freq: Three times a day (TID) | ORAL | 1 refills | Status: DC | PRN
Start: 2016-07-18 — End: 2016-10-31

## 2016-07-18 MED ORDER — GABAPENTIN 600 MG PO TABS: 600.0000 mg | ORAL_TABLET | Freq: Three times a day (TID) | ORAL | 1 refills | Status: DC
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
## 2016-07-18 NOTE — Telephone Encounter (Signed)
Patient was seen in the office today 07/18/2016. Patient stated she left her AVS at the front desk during [place]. AVS wasn't located. Patient stated the provider had written some important information about hypermobility. Patient request for someone to call and explain the diagnosis.

## 2016-07-18 NOTE — Patient Instructions (Addendum)
Thank you for coming in,   Sleep is an integral part of our bodies ability to recover from our daily activities and is key to making you body perform at its maximal potential.  Establishing and maintaining a healthy sleep pattern can not only make you feel better it can help many chronic illnesses including high blood pressure, high cholesterol, obesity, chronic pain syndromes, and many others.  Some key things to remember regarding sleep are:  Establish a consistent nightly routine that you do each night before bed.  Avoid caffeine, tobacco and alcohol as all of these drugs will cause sleep disturbances.    Establish an exercise routine.  This can be as simple as walking, dancing or what every you find gets your heart rate elevated to the point you can speak in only 3-4 word sentences.  Exercise will help make falling and staying asleep easier.    If you have difficulty with sleep, reserve the bedroom for sleeping; do not watch TV, read, eat or exercise in your bed room.      - Watching TV in bed can trick your brain into thinking it is day time and will reset your internal clock.  If you are going to watch TV before bed do so outside of the bedroom.  Although it is best to avoid screens for the hour prior to bed that is difficult to do in our current technology driven society - at the very least it is imperative that you remove TV from the bed room.      - If you have a hard time falling asleep avoid showering and exercising prior to bed.      Please feel free to call with any questions or concerns at any time, at [phone]. --Dr. Raeford Razor     IF you received an x-ray today, you will receive an invoice from [facility]. Please contact [facility] at [phone] with questions or concerns regarding your invoice.   IF you received labwork today, you will receive an invoice from [employer]. Please contact [facility] at [phone] with questions or  concerns regarding your invoice.   Our billing staff will not be able to assist you with questions regarding bills from these companies.  You will be contacted with the lab results as soon as they are available. The fastest way to get your results is to activate your My Chart account. Instructions are located on the last page of this paperwork. If you have not heard from [place] regarding the results in 2 weeks, please contact this office.
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
## 2016-07-19 NOTE — Telephone Encounter (Signed)
Hi Dr. Raeford Razor . Do you mind speaking with Ms. Curtiss

## 2016-07-19 NOTE — Telephone Encounter (Addendum)
She was seen by Dr. Sherley Bounds 07/11/2016, as was discussed with his cma.  He did not offer a narcotic at this visit.  Her care should be followed up with him as well as any controlled med.  This is what I discussed with her at her visit prior. She was seen by Dr. Everlene Farrier, however 07/18/2016.  No further back and forth necessary at this time, unless she contacts regarding the refill.

## 2016-07-21 NOTE — Telephone Encounter (Signed)
Left VM for patient. If she calls back please have her speak with a nurse/CMA and inform that most likely she has a component of hypermobility. This is based off of her Beighton score.   If any questions then please take the best time and phone number to call and I will try to call her back.   Rosemarie Ax, MD PGY-4, [facility] 07/21/2016, 12:47 PM

## 2016-07-22 NOTE — Telephone Encounter (Signed)
Spoke with patient about her questions and they were answered.  She has noticed an improvement in her back pain but still having pain in her legs.    Rosemarie Ax, MD PGY-4, [facility] 07/22/2016, 11:24 AM

## 2016-08-18 ENCOUNTER — Ambulatory Visit: Payer: Self-pay | Attending: Sports Medicine | Admitting: Physical Therapy

## 2016-08-18 ENCOUNTER — Encounter: Payer: Self-pay | Admitting: Physical Therapy

## 2016-08-18 DIAGNOSIS — G8929 Other chronic pain: Secondary | ICD-10-CM | POA: Insufficient documentation

## 2016-08-18 DIAGNOSIS — M546 Pain in thoracic spine: Secondary | ICD-10-CM | POA: Insufficient documentation

## 2016-08-18 DIAGNOSIS — M6281 Muscle weakness (generalized): Secondary | ICD-10-CM | POA: Insufficient documentation

## 2016-08-18 DIAGNOSIS — M5441 Lumbago with sciatica, right side: Secondary | ICD-10-CM | POA: Insufficient documentation

## 2016-08-18 DIAGNOSIS — M5442 Lumbago with sciatica, left side: Secondary | ICD-10-CM | POA: Insufficient documentation
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
## 2016-08-22 ENCOUNTER — Ambulatory Visit: Payer: Self-pay | Admitting: Physical Therapy

## 2016-08-24 ENCOUNTER — Ambulatory Visit: Payer: Self-pay | Admitting: Physical Therapy

## 2016-08-24 DIAGNOSIS — M5442 Lumbago with sciatica, left side: Principal | ICD-10-CM

## 2016-08-24 DIAGNOSIS — G8929 Other chronic pain: Secondary | ICD-10-CM

## 2016-08-24 DIAGNOSIS — M5441 Lumbago with sciatica, right side: Principal | ICD-10-CM

## 2016-08-24 DIAGNOSIS — M6281 Muscle weakness (generalized): Secondary | ICD-10-CM

## 2016-08-24 DIAGNOSIS — M546 Pain in thoracic spine: Secondary | ICD-10-CM

## 2016-08-25 NOTE — Therapy (Signed)
[place], [place], [address] Phone: [phone]   Fax:  [phone]  Physical Therapy Treatment  Patient Details  Name: [name] MRN: [id number] Date of Birth: [birth year] Referring Provider: Dr Shanon Ace   Encounter Date: 08/24/2016      PT End of Session - 08/25/16 1525    Visit Number 2   Number of Visits 12   Date for PT Re-Evaluation 09/29/16   Authorization Type Through lawyer    PT Start Time 1430   PT Stop Time 1510   PT Time Calculation (min) 40 min   Activity Tolerance Patient tolerated treatment well;Treatment limited secondary to medical complications (Comment)   Behavior During Therapy [facility] for tasks assessed/performed      Past Medical History:  Diagnosis Date  . Asthma   . Cancer ([place])   . Chronic back pain   . Disc narrowing   . Knee pain, bilateral   . Migraine   . Sciatica of left side     Past Surgical History:  Procedure Laterality Date  . ABDOMINAL HYSTERECTOMY    . BIOPSY BREAST    . PELVIC EXENTERATION      There were no vitals filed for this visit.      Subjective Assessment - 08/24/16 1435    Subjective (P)  Patient was 15 minutes late for her appointment. She was in the hospital with her grand chid on Monday. She reports the pain has been worse since that time. She has had significant pain in her knee and back.    Pertinent History (P)  Old history of lumbar pain    How long can you sit comfortably? (P)  > 1 hour    How long can you stand comfortably? (P)  > 5 minutes    How long can you walk comfortably? (P)  Household distances    Diagnostic tests (P)  MRI: lumbar spine minimal disc buldging at L4- L5 and L5 S1. Thoracic spine disc buldge at t-5t6 with possible contact with the spinal chord.    Currently in Pain? (P)  Yes   Pain Score (P)  8    Pain Location (P)  Back   Pain Orientation (P)  Mid   Pain Descriptors / Indicators (P)   Aching;Sharp   Pain Type (P)  Acute pain   Pain Onset (P)  More than a month ago   Pain Frequency (P)  Constant   Aggravating Factors  (P)  standing, lifting, walking    Pain Relieving Factors (P)  rest    Effect of Pain on Daily Activities (P)  difficulty perfroming ADl's    Multiple Pain Sites (P)  No                         [facility] Adult PT Treatment/Exercise - 08/25/16 0001      Lumbar Exercises: Stretches   Single Knee to Chest Stretch Limitations 2x15 sec hold   Lower Trunk Rotation Limitations x5 patient reported improved pain    Piriformis Stretch Limitations 1x20 second hold. Patient reported with right piriformis stretch that it felt like her hip bone was pushing through the table. Stretch held.      Moist Heat Therapy   Number Minutes Moist Heat 10 Minutes   Moist Heat Location Other (comment)  thoracic spine      Manual Therapy   Manual therapy comments Myofacial relase perfromed to the lumbar spine.  The patient was unable to tolerate tigger point release. Only minor spasming felt on the right side of her lumbar spine. Extreme reaction to light palpation.                 PT Education - 08/25/16 1525    Education provided Yes   Education Details significant education on symptom mangement    Person(s) Educated Patient   Methods Explanation;Demonstration;Tactile cues;Verbal cues   Comprehension Need further instruction          PT Short Term Goals - 08/25/16 1539      PT SHORT TERM GOAL #1   Title Patient will increase bilateral leg strngth to 5/5    Time 3   Period Weeks   Status New     PT SHORT TERM GOAL #2   Title Patient will report 5/10 pain at worst   Time 3   Period Weeks   Status On-going     PT SHORT TERM GOAL #3   Title Patient will report centralized lower back pain    Time 3   Period Weeks   Status On-going     PT SHORT TERM GOAL #4   Title Patient will report centralized lower back pain with no radiculopathy     Time 3   Period Weeks   Status On-going     PT SHORT TERM GOAL #5   Title Patient will be independent with inital HEP    Time 3   Period Weeks   Status On-going           PT Long Term Goals - 08/18/16 1607      PT LONG TERM GOAL #1   Title Patient will be independent with HEP for stretching and stregthening    Time 6   Period Weeks   Status New     PT LONG TERM GOAL #2   Title Patient will bend to pick item off the ground without increased pain    Time 6   Period Weeks   Status New     PT LONG TERM GOAL #3   Title Patient will stand for 1 hour without increased pain    Time 6   Period Weeks   Status New               Plan - 08/25/16 1536    Clinical Impression Statement POatient continues to be limited in what she can do. She has inconsitent reactions to different movements. She had pain with right pirifformis stretch. With soft tissue mobilization to the right side she reported pain shooting out of her side left side. IOnly light MFR perfromed but etreme pain reactions noted. It will remain difficutl to make progress 2nd to limited ability to tolerate basic movements and basic manual therapy.    Rehab Potential Fair   Clinical Impairments Affecting Rehab Potential multiple areas of significant pain in her spine limited tolerance to basic movements on evaluation and decreased tolerance to light palpation    PT Frequency 2x / week   PT Duration 6 weeks   PT Treatment/Interventions ADLs/Self Care Home Management;Cryotherapy;Functional mobility training;Stair training;Gait training;Ultrasound;Moist Heat;Therapeutic activities;Therapeutic exercise;Neuromuscular re-education;Patient/family education;Passive range of motion;Manual lymph drainage;Manual techniques;Splinting;Taping;Visual/perceptual remediation/compensation   PT Next Visit Plan Patient is very limited as to what she can do. Continue to work on soft tissue mobilization. Patient reports her TENS unit " woke  up her nerves" so avoid e-stim. Add exercises as able. Per patient the MD wants her to only  have 1 exercise per visit which is questionable.    PT Home Exercise Plan lateral trunk rotation;       Patient will benefit from skilled therapeutic intervention in order to improve the following deficits and impairments:  Abnormal gait, Decreased mobility, Decreased strength, Impaired sensation, Pain, Increased muscle spasms, Impaired UE functional use, Decreased endurance, Decreased activity tolerance, Decreased range of motion, Difficulty walking, Postural dysfunction  Visit Diagnosis: Chronic bilateral low back pain with bilateral sciatica  Muscle weakness (generalized)  Pain in thoracic spine     Problem List Patient Active Problem List   Diagnosis Date Noted  . Pain 07/18/2016  . Low back pain 05/29/2016  . Headache, migraine 04/01/2016    Carney Living PT DPT  08/25/2016, 3:42 PM  [facility] [address] [place], [place], [address] Phone: [phone]   Fax:  [phone]  Name: [name] MRN: [id number] Date of Birth: May 18, 1971

## 2016-08-30 ENCOUNTER — Ambulatory Visit: Payer: Self-pay | Admitting: Physical Therapy

## 2016-08-30 DIAGNOSIS — M5441 Lumbago with sciatica, right side: Principal | ICD-10-CM

## 2016-08-30 DIAGNOSIS — G8929 Other chronic pain: Secondary | ICD-10-CM

## 2016-08-30 DIAGNOSIS — M5442 Lumbago with sciatica, left side: Principal | ICD-10-CM

## 2016-08-30 DIAGNOSIS — M546 Pain in thoracic spine: Secondary | ICD-10-CM

## 2016-08-30 DIAGNOSIS — M6281 Muscle weakness (generalized): Secondary | ICD-10-CM

## 2016-08-31 NOTE — Therapy (Signed)
[place], [place], [address] Phone: [phone]   Fax:  [phone]  Physical Therapy Treatment  Patient Details  Name: [name] MRN: [id number] Date of Birth: Aug 31, 1971 Referring Provider: Dr Shanon Ace   Encounter Date: 08/30/2016      PT End of Session - 08/31/16 0756    Visit Number 3   Number of Visits 12   Date for PT Re-Evaluation 09/29/16   Authorization Type Through lawyer    PT Start Time 1545   PT Stop Time 1635   PT Time Calculation (min) 50 min   Activity Tolerance Patient tolerated treatment well   Behavior During Therapy [facility] for tasks assessed/performed      Past Medical History:  Diagnosis Date  . Asthma   . Cancer ([place])   . Chronic back pain   . Disc narrowing   . Knee pain, bilateral   . Migraine   . Sciatica of left side     Past Surgical History:  Procedure Laterality Date  . ABDOMINAL HYSTERECTOMY    . BIOPSY BREAST    . PELVIC EXENTERATION      There were no vitals filed for this visit.      Subjective Assessment - 08/30/16 1600    Subjective Patient reports continued pain in the middle of her back. She reports the pain in her legs have improved. She feels like her lower back is  hurting more after therapy though. She has been doing her exercises.    Pertinent History Old history of lumbar pain    Limitations Lifting;Walking;Standing   How long can you sit comfortably? > 1 hour    How long can you stand comfortably? > 5 minutes    How long can you walk comfortably? Household distances    Diagnostic tests MRI: lumbar spine minimal disc buldging at L4- L5 and L5 S1. Thoracic spine disc buldge at t-5t6 with possible contact with the spinal chord.    Currently in Pain? Yes   Pain Score 8    Pain Location Back   Pain Orientation Mid   Pain Descriptors / Indicators Aching;Sharp   Pain Type Acute pain   Pain Onset More than a month ago   Pain  Frequency Constant   Aggravating Factors  standing, lifting, walking,    Pain Relieving Factors rest    Effect of Pain on Daily Activities difficulty perfroming ADL's                          [facility] Adult PT Treatment/Exercise - 08/31/16 0001      Lumbar Exercises: Stretches   Single Knee to Chest Stretch Limitations 2x15 sec hold   Lower Trunk Rotation Limitations x5 patient reported improved pain    Piriformis Stretch Limitations 1x20 second hold. Patient reported with right piriformis stretch that it felt like her hip bone was pushing through the table. Stretch held.      Lumbar Exercises: Supine   Other Supine Lumbar Exercises Supine hip abduction with yellow band cuing to keep it in a small range 5x2; Supine pillow squeeze 2x10 with 1-2 second hold cuing not to thold it. Cuing also to stop the exercises if they make symptoms worst. . 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 The patient was unable to tolerate tigger point release. Only minor spasming felt on  the right side of her lumbar spine. Improved toelrance to trigger point release.                 PT Education - 08/31/16 0755    Education provided Yes   Education Details significant education on symptom mangement    Person(s) Educated Patient   Methods Demonstration;Explanation;Tactile cues;Verbal cues   Comprehension Verbalized understanding;Need further instruction          PT Short Term Goals - 08/31/16 0814      PT SHORT TERM GOAL #1   Title Patient will increase bilateral leg strngth to 5/5    Time 3   Period Weeks   Status New     PT SHORT TERM GOAL #2   Title Patient will report 5/10 pain at worst   Time 3   Period Weeks   Status On-going     PT SHORT TERM GOAL #3   Title Patient will report centralized lower back pain    Time 3   Period Weeks   Status On-going     PT SHORT TERM GOAL #4   Title Patient  will report centralized lower back pain with no radiculopathy    Baseline No radicular pain noted assess carryover    Time 3   Period Weeks   Status On-going     PT SHORT TERM GOAL #5   Title Patient will be independent with inital HEP    Time 3   Period Weeks   Status On-going     PT SHORT TERM GOAL #6   Title Patient will be independent with basic exercise program.    Time 4   Period Weeks   Status On-going           PT Long Term Goals - 08/18/16 1607      PT LONG TERM GOAL #1   Title Patient will be independent with HEP for stretching and stregthening    Time 6   Period Weeks   Status New     PT LONG TERM GOAL #2   Title Patient will bend to pick item off the ground without increased pain    Time 6   Period Weeks   Status New     PT LONG TERM GOAL #3   Title Patient will stand for 1 hour without increased pain    Time 6   Period Weeks   Status New               Plan - 08/31/16 0756    Clinical Impression Statement Patient was able to tolerate more activity today. She was sless sensative to light touch. She was able to perfrom stretches better. She has had an improvement with radicular symptoms but she continues to have significant thoracic pain. .     Rehab Potential Fair   Clinical Impairments Affecting Rehab Potential multiple areas of significant pain in her spine limited tolerance to basic movements on evaluation and decreased tolerance to light palpation    PT Frequency 2x / week   PT Duration 6 weeks   PT Treatment/Interventions ADLs/Self Care Home Management;Cryotherapy;Functional mobility training;Stair training;Gait training;Ultrasound;Moist Heat;Therapeutic activities;Therapeutic exercise;Neuromuscular re-education;Patient/family education;Passive range of motion;Manual lymph drainage;Manual techniques;Splinting;Taping;Visual/perceptual remediation/compensation   PT Next Visit Plan Patient is very limited as to what she can do. Continue to work  on soft tissue mobilization. Patient reports her TENS unit " woke up her nerves" so avoid e-stim. Add exercises as able. Per patient the MD wants her to  only have 1 exercise per visit which is questionable.    PT Home Exercise Plan lateral trunk rotation;    Consulted and Agree with Plan of Care Patient      Patient will benefit from skilled therapeutic intervention in order to improve the following deficits and impairments:  Abnormal gait, Decreased mobility, Decreased strength, Impaired sensation, Pain, Increased muscle spasms, Impaired UE functional use, Decreased endurance, Decreased activity tolerance, Decreased range of motion, Difficulty walking, Postural dysfunction  Visit Diagnosis: Chronic bilateral low back pain with bilateral sciatica  Muscle weakness (generalized)  Pain in thoracic spine     Problem List Patient Active Problem List   Diagnosis Date Noted  . Pain 07/18/2016  . Low back pain 05/29/2016  . Headache, migraine 04/01/2016    Carney Living PT DPT  08/31/2016, 9:55 AM  [facility] [address] [place], [place], [address] Phone: [phone]   Fax:  [phone]  Name: [name] MRN: [id number] Date of Birth: Jul 07, 1971

## 2016-09-01 ENCOUNTER — Ambulatory Visit: Payer: Self-pay | Admitting: Physical Therapy

## 2016-09-06 ENCOUNTER — Ambulatory Visit: Payer: Self-pay | Attending: Sports Medicine | Admitting: Physical Therapy

## 2016-09-06 DIAGNOSIS — M546 Pain in thoracic spine: Secondary | ICD-10-CM

## 2016-09-06 DIAGNOSIS — M5442 Lumbago with sciatica, left side: Secondary | ICD-10-CM | POA: Insufficient documentation

## 2016-09-06 DIAGNOSIS — M6281 Muscle weakness (generalized): Secondary | ICD-10-CM

## 2016-09-06 DIAGNOSIS — G8929 Other chronic pain: Secondary | ICD-10-CM

## 2016-09-06 DIAGNOSIS — M5441 Lumbago with sciatica, right side: Secondary | ICD-10-CM | POA: Insufficient documentation

## 2016-09-07 NOTE — Therapy (Signed)
[place], [place], [address] Phone: [phone]   Fax:  [phone]  Physical Therapy Treatment/ Discharge   Patient Details  Name: [name] MRN: [id number] Date of Birth: 09-16-1971 Referring Provider: Dr Shanon Ace   Encounter Date: 09/06/2016      PT End of Session - 09/06/16 1627    Visit Number 4   Number of Visits 12   Date for PT Re-Evaluation 09/29/16   Authorization Type Through lawyer    PT Start Time 1545   PT Stop Time 1612   PT Time Calculation (min) 27 min   Activity Tolerance Patient tolerated treatment well   Behavior During Therapy [facility] for tasks assessed/performed      Past Medical History:  Diagnosis Date  . Asthma   . Cancer ([place])   . Chronic back pain   . Disc narrowing   . Knee pain, bilateral   . Migraine   . Sciatica of left side     Past Surgical History:  Procedure Laterality Date  . ABDOMINAL HYSTERECTOMY    . BIOPSY BREAST    . PELVIC EXENTERATION      There were no vitals filed for this visit.      Subjective Assessment - 09/06/16 1552    Subjective Patient reports after the last visit she had pain for 2 days and she had a hard time getting out of bed. She reportes the stretches and exercises make the pain much worse. She is only doing 2 stretches and 2 exercises at this time. She has been advised not to do the exercises if they hurt. She has been very compliant with her treatment but she is not getting any relief.    Pertinent History Old history of lumbar pain    Limitations Lifting;Walking;Standing   How long can you sit comfortably? > 1 hour    How long can you stand comfortably? > 5 minutes    How long can you walk comfortably? Household distances    Diagnostic tests MRI: lumbar spine minimal disc buldging at L4- L5 and L5 S1. Thoracic spine disc buldge at t-5t6 with possible contact with the spinal chord.    Currently in Pain? Yes    Pain Score 9    Pain Orientation Mid   Pain Descriptors / Indicators Aching   Pain Type Acute pain   Pain Onset More than a month ago   Pain Frequency Constant   Aggravating Factors  standing, lifting, walking    Pain Relieving Factors rest    Effect of Pain on Daily Activities difficulty perfroming ADL's                          [facility] Adult PT Treatment/Exercise - 09/07/16 0001      Self-Care   Other Self-Care Comments  Therapy spent time educating the patient on symptom mangement and stretegies to keep moving as much as possible without causing too much pain. 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 The patient was unable to tolerate tigger point release. Only minor spasming felt on  the right side no spasming felt on the left. Patient rpeorted pain shooting into her bone with light MFR. Treatment stopped.  PT Education - 09/06/16 1623    Education provided Yes   Education Details education on symptom mangement.    Person(s) Educated Patient   Methods Explanation;Demonstration;Tactile cues;Verbal cues   Comprehension Verbalized understanding;Returned demonstration          PT Short Term Goals - 08/31/16 0814      PT SHORT TERM GOAL #1   Title Patient will increase bilateral leg strngth to 5/5    Time 3   Period Weeks   Status New     PT SHORT TERM GOAL #2   Title Patient will report 5/10 pain at worst   Time 3   Period Weeks   Status On-going     PT SHORT TERM GOAL #3   Title Patient will report centralized lower back pain    Time 3   Period Weeks   Status On-going     PT SHORT TERM GOAL #4   Title Patient will report centralized lower back pain with no radiculopathy    Baseline No radicular pain noted assess carryover    Time 3   Period Weeks   Status On-going     PT SHORT TERM GOAL #5   Title  Patient will be independent with inital HEP    Time 3   Period Weeks   Status On-going     PT SHORT TERM GOAL #6   Title Patient will be independent with basic exercise program.    Time 4   Period Weeks   Status On-going           PT Long Term Goals - 08/18/16 1607      PT LONG TERM GOAL #1   Title Patient will be independent with HEP for stretching and stregthening    Time 6   Period Weeks   Status New     PT LONG TERM GOAL #2   Title Patient will bend to pick item off the ground without increased pain    Time 6   Period Weeks   Status New     PT LONG TERM GOAL #3   Title Patient will stand for 1 hour without increased pain    Time 6   Period Weeks   Status New               Plan - 09/06/16 1627    Clinical Impression Statement Patient had a significant increase in symptoms with soft tissue mobilization with minimal pressure. She reported feeling like the pain was shooting into her bone. Therapy performed only light MFR to her lumbar spine for 3-4 minutes. Therapy also perfromed light long axis distraction which has helped in the past. She reports this time it felt like it was cutting off the nerves in her left leg. At this time the patient is not appropriate for PT. She has an abnormaly high amount of pain with light touch and with light stretching and core activity. She reports her tens machine makes her feel like electricity is shooting down her legs so modalities have not been used. She revcieved extensive education on symptom mangement. She was advised to return to her MD. She reported feeling like someone was tocuhing her foot after treatment today. She felt " bursts " of pain in her legs. D/C for PT at this time 2nd to lack of progress and increased symptoms with light activity.    Rehab Potential Fair   Clinical Impairments Affecting Rehab Potential multiple areas of significant pain in her spine  limited tolerance to basic movements on evaluation and decreased  tolerance to light palpation    PT Frequency 2x / week   PT Duration 6 weeks   PT Treatment/Interventions ADLs/Self Care Home Management;Cryotherapy;Functional mobility training;Stair training;Gait training;Ultrasound;Moist Heat;Therapeutic activities;Therapeutic exercise;Neuromuscular re-education;Patient/family education;Passive range of motion;Manual lymph drainage;Manual techniques;Splinting;Taping;Visual/perceptual remediation/compensation   PT Next Visit Plan Patient is very limited as to what she can do. Continue to work on soft tissue mobilization. Patient reports her TENS unit " woke up her nerves" so avoid e-stim. Add exercises as able. Per patient the MD wants her to only have 1 exercise per visit which is questionable.    PT Home Exercise Plan lateral trunk rotation; piriformis stretch, ball squeeze, hip abduction,    Consulted and Agree with Plan of Care Patient      Patient will benefit from skilled therapeutic intervention in order to improve the following deficits and impairments:  Abnormal gait, Decreased mobility, Decreased strength, Impaired sensation, Pain, Increased muscle spasms, Impaired UE functional use, Decreased endurance, Decreased activity tolerance, Decreased range of motion, Difficulty walking, Postural dysfunction  Visit Diagnosis: Chronic bilateral low back pain with bilateral sciatica  Muscle weakness (generalized)  Pain in thoracic spine     Problem List Patient Active Problem List   Diagnosis Date Noted  . Pain 07/18/2016  . Low back pain 05/29/2016  . Headache, migraine 04/01/2016    Carney Living PT DPT  09/07/2016, 2:51 PM  [facility] [address] [place], [place], [address] Phone: [phone]   Fax:  [phone]  Name: [name] MRN: [id number] Date of Birth: [birth year]

## 2016-09-08 ENCOUNTER — Ambulatory Visit: Payer: Self-pay | Admitting: Physical Therapy

## 2016-09-13 ENCOUNTER — Ambulatory Visit: Payer: Self-pay | Admitting: Physical Therapy

## 2016-09-15 ENCOUNTER — Encounter: Payer: Self-pay | Admitting: Physical Therapy

## 2016-09-15 ENCOUNTER — Other Ambulatory Visit: Payer: Self-pay | Admitting: Family Medicine

## 2016-10-25 ENCOUNTER — Encounter (HOSPITAL_COMMUNITY): Payer: Self-pay | Admitting: *Deleted

## 2016-10-25 ENCOUNTER — Emergency Department (HOSPITAL_COMMUNITY): Payer: No Typology Code available for payment source

## 2016-10-25 ENCOUNTER — Emergency Department (HOSPITAL_COMMUNITY)
Admission: EM | Admit: 2016-10-25 | Discharge: 2016-10-26 | Disposition: A | Discharge status: Home / Self Care | Payer: No Typology Code available for payment source | Attending: Physician Assistant | Admitting: Physician Assistant

## 2016-10-25 DIAGNOSIS — G8929 Other chronic pain: Secondary | ICD-10-CM | POA: Insufficient documentation

## 2016-10-25 DIAGNOSIS — F17210 Nicotine dependence, cigarettes, uncomplicated: Secondary | ICD-10-CM | POA: Diagnosis not present

## 2016-10-25 DIAGNOSIS — M549 Dorsalgia, unspecified: Secondary | ICD-10-CM | POA: Diagnosis present

## 2016-10-25 DIAGNOSIS — J45909 Unspecified asthma, uncomplicated: Secondary | ICD-10-CM | POA: Diagnosis not present

## 2016-10-25 DIAGNOSIS — M5442 Lumbago with sciatica, left side: Secondary | ICD-10-CM | POA: Diagnosis not present

## 2016-10-25 DIAGNOSIS — Z79899 Other long term (current) drug therapy: Secondary | ICD-10-CM | POA: Insufficient documentation

## 2016-10-25 DIAGNOSIS — R200 Anesthesia of skin: Secondary | ICD-10-CM

## 2016-10-25 DIAGNOSIS — Z859 Personal history of malignant neoplasm, unspecified: Secondary | ICD-10-CM | POA: Insufficient documentation

## 2016-10-25 DIAGNOSIS — M5441 Lumbago with sciatica, right side: Secondary | ICD-10-CM | POA: Insufficient documentation

## 2016-10-25 DIAGNOSIS — R202 Paresthesia of skin: Secondary | ICD-10-CM

## 2016-10-25 MED ORDER — LORAZEPAM 2 MG/ML IJ SOLN
0.5000 mg | Freq: Once | INTRAMUSCULAR | Status: AC
  Administered 2016-10-25: 0.5 mg via INTRAMUSCULAR
  Filled 2016-10-25: qty 1

## 2016-10-25 MED ORDER — HYDROCODONE-ACETAMINOPHEN 5-325 MG PO TABS
1.0000 | ORAL_TABLET | Freq: Once | ORAL | Status: AC
  Administered 2016-10-25: 1 via ORAL
  Filled 2016-10-25: qty 1

## 2016-10-25 NOTE — ED Notes (Signed)
Pt back from MRI 

## 2016-10-25 NOTE — ED Notes (Signed)
Pt called out c/o no relief w/ pain medication. She stated "I came here for help and the Dr. told me I would have to pay for the MRI, but I'm in so much pain my body is not coping." Explained to pt delay in MRI and EDP made aware.

## 2016-10-25 NOTE — ED Notes (Signed)
Patient transported to MRI 
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
## 2016-10-25 NOTE — ED Notes (Signed)
Spoke w/ MRI to inquire on time frame. They stated it would take anywhere from 30 to 40 mins for pt to be taken back.
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
## 2016-10-26 MED ORDER — KETOROLAC TROMETHAMINE 15 MG/ML IJ SOLN
30.0000 mg | Freq: Once | INTRAMUSCULAR | Status: AC
  Administered 2016-10-26: 30 mg via INTRAMUSCULAR
  Filled 2016-10-26: qty 2

## 2016-10-26 NOTE — Discharge Instructions (Signed)
You need to schedule an appointment with your spine surgeon tomorrow. You may continue to take ibuprofen and tylenol for pain. Use a heating pad on your lower back. Please return to the ED if your symptoms worsen. You need to follow up with your spine doctor.

## 2016-10-28 ENCOUNTER — Encounter (HOSPITAL_COMMUNITY): Payer: Self-pay

## 2016-10-28 ENCOUNTER — Emergency Department (HOSPITAL_COMMUNITY)
Admission: EM | Admit: 2016-10-28 | Discharge: 2016-10-29 | Disposition: A | Discharge status: Other Acute Inpatient Hospital | Payer: Federal, State, Local not specified - Other | Attending: Emergency Medicine | Admitting: Emergency Medicine

## 2016-10-28 DIAGNOSIS — T424X2A Poisoning by benzodiazepines, intentional self-harm, initial encounter: Secondary | ICD-10-CM | POA: Insufficient documentation

## 2016-10-28 DIAGNOSIS — F322 Major depressive disorder, single episode, severe without psychotic features: Secondary | ICD-10-CM | POA: Diagnosis present

## 2016-10-28 DIAGNOSIS — Y638 Failure in dosage during other surgical and medical care: Secondary | ICD-10-CM | POA: Insufficient documentation

## 2016-10-28 DIAGNOSIS — T426X2A Poisoning by other antiepileptic and sedative-hypnotic drugs, intentional self-harm, initial encounter: Secondary | ICD-10-CM | POA: Insufficient documentation

## 2016-10-28 DIAGNOSIS — J45909 Unspecified asthma, uncomplicated: Secondary | ICD-10-CM | POA: Insufficient documentation

## 2016-10-28 DIAGNOSIS — F17210 Nicotine dependence, cigarettes, uncomplicated: Secondary | ICD-10-CM | POA: Insufficient documentation

## 2016-10-28 DIAGNOSIS — Z79899 Other long term (current) drug therapy: Secondary | ICD-10-CM | POA: Insufficient documentation

## 2016-10-28 DIAGNOSIS — F323 Major depressive disorder, single episode, severe with psychotic features: Secondary | ICD-10-CM | POA: Insufficient documentation

## 2016-10-28 DIAGNOSIS — T50902A Poisoning by unspecified drugs, medicaments and biological substances, intentional self-harm, initial encounter: Secondary | ICD-10-CM

## 2016-10-28 LAB — COMPREHENSIVE METABOLIC PANEL
ALBUMIN: 5 g/dL (ref 3.5–5.0)
ALK PHOS: 54 U/L (ref 38–126)
ALT: 17 U/L (ref 14–54)
ANION GAP: 8 (ref 5–15)
AST: 19 U/L (ref 15–41)
BILIRUBIN TOTAL: 0.4 mg/dL (ref 0.3–1.2)
BUN: 16 mg/dL (ref 6–20)
CALCIUM: 9.9 mg/dL (ref 8.9–10.3)
CO2: 27 mmol/L (ref 22–32)
CREATININE: 0.77 mg/dL (ref 0.44–1.00)
Chloride: 109 mmol/L (ref 101–111)
GFR calc Af Amer: >60 mL/min (ref >60)
GFR calc non Af Amer: >60 mL/min (ref >60)
GLUCOSE: 81 mg/dL (ref 65–99)
Potassium: 4 mmol/L (ref 3.5–5.1)
Sodium: 144 mmol/L (ref 135–145)
TOTAL PROTEIN: 7.5 g/dL (ref 6.5–8.1)

## 2016-10-28 LAB — RAPID URINE DRUG SCREEN, HOSP PERFORMED
Amphetamines: NOT DETECTED
BARBITURATES: NOT DETECTED
Benzodiazepines: POSITIVE — AB
COCAINE: NOT DETECTED
OPIATES: NOT DETECTED
Tetrahydrocannabinol: NOT DETECTED

## 2016-10-28 LAB — CBC WITH DIFFERENTIAL/PLATELET
BASOS PCT: 0 %
Basophils Absolute: 0 10*3/uL (ref 0.0–0.1)
EOS ABS: 0.1 10*3/uL (ref 0.0–0.7)
Eosinophils Relative: 1 %
HCT: 39.7 % (ref 36.0–46.0)
Hemoglobin: 13.1 g/dL (ref 12.0–15.0)
Lymphocytes Relative: 25 %
Lymphs Abs: 2.2 10*3/uL (ref 0.7–4.0)
MCH: 30.1 pg (ref 26.0–34.0)
MCHC: 33 g/dL (ref 30.0–36.0)
MCV: 91.3 fL (ref 78.0–100.0)
MONO ABS: 0.6 10*3/uL (ref 0.1–1.0)
MONOS PCT: 6 %
Neutro Abs: 6.2 10*3/uL (ref 1.7–7.7)
Neutrophils Relative %: 68 %
Platelets: 385 10*3/uL (ref 150–400)
RBC: 4.35 MIL/uL (ref 3.87–5.11)
RDW: 13.3 % (ref 11.5–15.5)
WBC: 9.1 10*3/uL (ref 4.0–10.5)

## 2016-10-28 LAB — SALICYLATE LEVEL: Salicylate Lvl: <7 mg/dL (ref 2.8–30.0)

## 2016-10-28 LAB — ETHANOL: Alcohol, Ethyl (B): <5 mg/dL (ref <5)

## 2016-10-28 LAB — POC URINE PREG, ED: PREG TEST UR: NEGATIVE

## 2016-10-28 LAB — ACETAMINOPHEN LEVEL

## 2016-10-28 NOTE — ED Notes (Signed)
Called to poison control. Per three meds listed: No charcoal Pt may experience abdominal pain, n/v Lethargic  May be agitated.  Ok to give benzos for agitation even with ingestion of klonopin. EKG/cardiac monitoring  8 hours observation starting on arrival to ED d/t estimated time of ingestion Labs including tylenol, aspirin, Hepatic function along with basics.  MD notified.

## 2016-10-28 NOTE — ED Notes (Addendum)
Patient belongings Striped pants Black bra Green t shirt Black jacket with faux fur hood Wallet w/ elephants Alka seltzer Random earrings Several photos Several business cards 21 cents Brown/yellow capsule NCDL Prepaid visa debit AAA card [employer] card NO [employer] boots glasses

## 2016-10-28 NOTE — ED Notes (Addendum)
Pt mother Elsie Amis can be notified at [phone] regarding information and updates for pt.

## 2016-10-28 NOTE — ED Notes (Signed)
Spoke with PC, gave up dates and ekg, blood work and v/s. Will call back

## 2016-10-28 NOTE — ED Triage Notes (Addendum)
Per EMS, pt here from home.  Pt took 12 neurontin, 2 toradol, 15 klonopin.  Pt denied intention at first.  Pt was a/o on arrival.  While EMS outside, pt took additional meds.  Above is estimated total.  EMS states neurontin 60 tabs filled on 12/26. Only 15 left in bottle.  Pt was lethargic on arrival to ED.  Vitals: 98% ra, cbg 130, hr 90, resp 22, 136/84.  20 g LAC, NSL

## 2016-10-28 NOTE — ED Notes (Signed)
Patient's boyfriend [name] may be contacted for information along with updates and or ride home [phone].

## 2016-10-29 ENCOUNTER — Encounter (HOSPITAL_COMMUNITY): Payer: Self-pay

## 2016-10-29 ENCOUNTER — Inpatient Hospital Stay (HOSPITAL_COMMUNITY)
Admission: AD | Admit: 2016-10-29 | Discharge: 2016-10-31 | DRG: 882 | Disposition: A | Discharge status: Home / Self Care | Payer: Federal, State, Local not specified - Other | Attending: Psychiatry | Admitting: Psychiatry

## 2016-10-29 DIAGNOSIS — F4323 Adjustment disorder with mixed anxiety and depressed mood: Principal | ICD-10-CM | POA: Clinically undetermined

## 2016-10-29 DIAGNOSIS — Z79899 Other long term (current) drug therapy: Secondary | ICD-10-CM | POA: Diagnosis not present

## 2016-10-29 DIAGNOSIS — F1320 Sedative, hypnotic or anxiolytic dependence, uncomplicated: Secondary | ICD-10-CM | POA: Diagnosis present

## 2016-10-29 DIAGNOSIS — R45851 Suicidal ideations: Secondary | ICD-10-CM

## 2016-10-29 DIAGNOSIS — T50902A Poisoning by unspecified drugs, medicaments and biological substances, intentional self-harm, initial encounter: Secondary | ICD-10-CM | POA: Insufficient documentation

## 2016-10-29 DIAGNOSIS — Z9889 Other specified postprocedural states: Secondary | ICD-10-CM

## 2016-10-29 DIAGNOSIS — Z888 Allergy status to other drugs, medicaments and biological substances status: Secondary | ICD-10-CM

## 2016-10-29 DIAGNOSIS — M797 Fibromyalgia: Secondary | ICD-10-CM

## 2016-10-29 DIAGNOSIS — F322 Major depressive disorder, single episode, severe without psychotic features: Secondary | ICD-10-CM | POA: Diagnosis present

## 2016-10-29 DIAGNOSIS — T424X2A Poisoning by benzodiazepines, intentional self-harm, initial encounter: Secondary | ICD-10-CM | POA: Diagnosis not present

## 2016-10-29 DIAGNOSIS — F17210 Nicotine dependence, cigarettes, uncomplicated: Secondary | ICD-10-CM | POA: Diagnosis present

## 2016-10-29 DIAGNOSIS — T426X2A Poisoning by other antiepileptic and sedative-hypnotic drugs, intentional self-harm, initial encounter: Secondary | ICD-10-CM | POA: Diagnosis not present

## 2016-10-29 DIAGNOSIS — G894 Chronic pain syndrome: Secondary | ICD-10-CM | POA: Diagnosis present

## 2016-10-29 DIAGNOSIS — Z791 Long term (current) use of non-steroidal anti-inflammatories (NSAID): Secondary | ICD-10-CM | POA: Diagnosis not present

## 2016-10-29 DIAGNOSIS — Z859 Personal history of malignant neoplasm, unspecified: Secondary | ICD-10-CM

## 2016-10-29 DIAGNOSIS — T398X2A Poisoning by other nonopioid analgesics and antipyretics, not elsewhere classified, intentional self-harm, initial encounter: Secondary | ICD-10-CM | POA: Diagnosis not present

## 2016-10-29 DIAGNOSIS — Z8489 Family history of other specified conditions: Secondary | ICD-10-CM

## 2016-10-29 DIAGNOSIS — Z833 Family history of diabetes mellitus: Secondary | ICD-10-CM | POA: Diagnosis not present

## 2016-10-29 DIAGNOSIS — T1491XA Suicide attempt, initial encounter: Secondary | ICD-10-CM

## 2016-10-29 DIAGNOSIS — Z808 Family history of malignant neoplasm of other organs or systems: Secondary | ICD-10-CM | POA: Diagnosis not present

## 2016-10-29 MED ORDER — GABAPENTIN 300 MG PO CAPS
300.0000 mg | ORAL_CAPSULE | Freq: Three times a day (TID) | ORAL | Status: DC
  Filled 2016-10-29: qty 1

## 2016-10-29 MED ORDER — LORAZEPAM 1 MG PO TABS: 0.0000 mg | ORAL_TABLET | Freq: Two times a day (BID) | ORAL | Status: DC

## 2016-10-29 MED ORDER — LORAZEPAM 2 MG/ML IJ SOLN: 1.0000 mg | Freq: Four times a day (QID) | INTRAMUSCULAR | Status: DC | PRN

## 2016-10-29 MED ORDER — MAGNESIUM HYDROXIDE 400 MG/5ML PO SUSP: 30.0000 mL | Freq: Every day | ORAL | Status: DC | PRN

## 2016-10-29 MED ORDER — LORAZEPAM 1 MG PO TABS: 0.0000 mg | ORAL_TABLET | Freq: Four times a day (QID) | ORAL | Status: DC

## 2016-10-29 MED ORDER — ALUM & MAG HYDROXIDE-SIMETH 200-200-20 MG/5ML PO SUSP: 30.0000 mL | ORAL | Status: DC | PRN

## 2016-10-29 MED ORDER — LORAZEPAM 1 MG PO TABS
1.0000 mg | ORAL_TABLET | Freq: Four times a day (QID) | ORAL | Status: DC | PRN
  Administered 2016-10-29 – 2016-10-30 (×2): 1 mg via ORAL
  Filled 2016-10-29 (×2): qty 1

## 2016-10-29 MED ORDER — IBUPROFEN 600 MG PO TABS
600.0000 mg | ORAL_TABLET | Freq: Four times a day (QID) | ORAL | Status: DC | PRN
  Administered 2016-10-29 – 2016-10-31 (×4): 600 mg via ORAL
  Filled 2016-10-29 (×4): qty 1

## 2016-10-29 MED ORDER — FOLIC ACID 1 MG PO TABS
1.0000 mg | ORAL_TABLET | Freq: Every day | ORAL | Status: DC
  Administered 2016-10-30 – 2016-10-31 (×2): 1 mg via ORAL
  Filled 2016-10-29 (×4): qty 1

## 2016-10-29 MED ORDER — PANTOPRAZOLE SODIUM 40 MG PO TBEC: 40.0000 mg | DELAYED_RELEASE_TABLET | Freq: Every day | ORAL | Status: DC

## 2016-10-29 MED ORDER — ADULT MULTIVITAMIN W/MINERALS CH
1.0000 | ORAL_TABLET | Freq: Every day | ORAL | Status: DC
  Administered 2016-10-30 – 2016-10-31 (×2): 1 via ORAL
  Filled 2016-10-29 (×4): qty 1

## 2016-10-29 MED ORDER — VITAMIN B-1 100 MG PO TABS
100.0000 mg | ORAL_TABLET | Freq: Every day | ORAL | Status: DC
  Administered 2016-10-29: 100 mg via ORAL
  Filled 2016-10-29: qty 1

## 2016-10-29 MED ORDER — GABAPENTIN 300 MG PO CAPS
300.0000 mg | ORAL_CAPSULE | Freq: Three times a day (TID) | ORAL | Status: DC
  Administered 2016-10-29: 300 mg via ORAL
  Filled 2016-10-29: qty 1

## 2016-10-29 MED ORDER — ACETAMINOPHEN 325 MG PO TABS
650.0000 mg | ORAL_TABLET | Freq: Four times a day (QID) | ORAL | Status: DC | PRN
  Administered 2016-10-30: 650 mg via ORAL
  Filled 2016-10-29: qty 2

## 2016-10-29 MED ORDER — TOPIRAMATE 25 MG PO TABS
150.0000 mg | ORAL_TABLET | Freq: Every day | ORAL | Status: DC
  Administered 2016-10-29: 10:00:00 150 mg via ORAL
  Filled 2016-10-29: qty 1

## 2016-10-29 MED ORDER — LORAZEPAM 1 MG PO TABS
0.0000 mg | ORAL_TABLET | Freq: Four times a day (QID) | ORAL | Status: DC
  Administered 2016-10-30: 2 mg via ORAL
  Filled 2016-10-29 (×2): qty 2

## 2016-10-29 MED ORDER — GABAPENTIN 300 MG PO CAPS
300.0000 mg | ORAL_CAPSULE | Freq: Three times a day (TID) | ORAL | Status: DC
  Administered 2016-10-29 – 2016-10-30 (×2): 300 mg via ORAL
  Filled 2016-10-29 (×9): qty 1

## 2016-10-29 MED ORDER — IBUPROFEN 200 MG PO TABS
600.0000 mg | ORAL_TABLET | Freq: Four times a day (QID) | ORAL | Status: DC | PRN
  Administered 2016-10-29 (×3): 600 mg via ORAL
  Filled 2016-10-29 (×3): qty 3

## 2016-10-29 MED ORDER — CITALOPRAM HYDROBROMIDE 10 MG PO TABS
10.0000 mg | ORAL_TABLET | Freq: Every day | ORAL | Status: DC
  Filled 2016-10-29: qty 1

## 2016-10-29 MED ORDER — CITALOPRAM HYDROBROMIDE 10 MG PO TABS
10.0000 mg | ORAL_TABLET | Freq: Every day | ORAL | Status: DC
  Filled 2016-10-29 (×3): qty 1

## 2016-10-29 MED ORDER — LORAZEPAM 1 MG PO TABS: 1.0000 mg | ORAL_TABLET | Freq: Four times a day (QID) | ORAL | Status: DC | PRN

## 2016-10-29 MED ORDER — ADULT MULTIVITAMIN W/MINERALS CH
1.0000 | ORAL_TABLET | Freq: Every day | ORAL | Status: DC
  Administered 2016-10-29: 1 via ORAL
  Filled 2016-10-29: qty 1

## 2016-10-29 MED ORDER — NICOTINE 21 MG/24HR TD PT24
21.0000 mg | MEDICATED_PATCH | Freq: Every day | TRANSDERMAL | Status: DC
  Administered 2016-10-30 – 2016-10-31 (×2): 21 mg via TRANSDERMAL
  Filled 2016-10-29 (×4): qty 1

## 2016-10-29 MED ORDER — NICOTINE POLACRILEX 2 MG MT GUM
CHEWING_GUM | OROMUCOSAL | Status: AC
  Administered 2016-10-29: 23:00:00
  Filled 2016-10-29: qty 1

## 2016-10-29 MED ORDER — MELOXICAM 15 MG PO TABS
15.0000 mg | ORAL_TABLET | Freq: Every day | ORAL | Status: DC
  Filled 2016-10-29: qty 1

## 2016-10-29 MED ORDER — LIDOCAINE 5 % EX PTCH
1.0000 | MEDICATED_PATCH | CUTANEOUS | Status: DC
  Filled 2016-10-29 (×2): qty 1

## 2016-10-29 MED ORDER — THIAMINE HCL 100 MG/ML IJ SOLN: 100.0000 mg | Freq: Every day | INTRAMUSCULAR | Status: DC

## 2016-10-29 MED ORDER — FOLIC ACID 1 MG PO TABS
1.0000 mg | ORAL_TABLET | Freq: Every day | ORAL | Status: DC
  Administered 2016-10-29: 1 mg via ORAL
  Filled 2016-10-29: qty 1

## 2016-10-29 MED ORDER — VITAMIN B-1 100 MG PO TABS
100.0000 mg | ORAL_TABLET | Freq: Every day | ORAL | Status: DC
  Administered 2016-10-30: 100 mg via ORAL
  Filled 2016-10-29 (×2): qty 1

## 2016-10-29 MED ORDER — HYDROXYZINE HCL 50 MG PO TABS
50.0000 mg | ORAL_TABLET | Freq: Once | ORAL | Status: AC
  Administered 2016-10-29: 50 mg via ORAL
  Filled 2016-10-29 (×2): qty 1

## 2016-10-29 MED ORDER — TOPIRAMATE 25 MG PO TABS
150.0000 mg | ORAL_TABLET | Freq: Every day | ORAL | Status: DC
  Administered 2016-10-30 – 2016-10-31 (×2): 150 mg via ORAL
  Filled 2016-10-29 (×4): qty 2

## 2016-10-29 MED ORDER — INFLUENZA VAC SPLIT QUAD 0.5 ML IM SUSY
0.5000 mL | PREFILLED_SYRINGE | INTRAMUSCULAR | Status: AC
  Administered 2016-10-30: 0.5 mL via INTRAMUSCULAR
  Filled 2016-10-29: qty 0.5

## 2016-10-29 MED ORDER — LIDOCAINE 5 % EX PTCH
1.0000 | MEDICATED_PATCH | CUTANEOUS | Status: DC
  Administered 2016-10-29: 1 via TRANSDERMAL
  Filled 2016-10-29: qty 1
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
## 2016-10-29 NOTE — ED Provider Notes (Addendum)
[facility] DEPT Provider Note   CSN: [id number] Arrival date & time: 10/28/16  1806     History   Chief Complaint Chief Complaint  Patient presents with  . Drug Overdose    HPI [name] is a 45 y.o. female.  The history is provided by the patient.  Drug Overdose  This is a new problem. The current episode started 1 to 2 hours ago. The problem occurs constantly. The problem has not changed since onset.Pertinent negatives include no chest pain and no abdominal pain. Associated symptoms comments: Chronic low back pain. Nothing aggravates the symptoms. Nothing relieves the symptoms. She has tried nothing for the symptoms.    Past Medical History:  Diagnosis Date  . Asthma   . Cancer ([place])   . Chronic back pain   . Disc narrowing   . Knee pain, bilateral   . Migraine   . Sciatica of left side     Patient Active Problem List   Diagnosis Date Noted  . Pain 07/18/2016  . Low back pain 05/29/2016  . Headache, migraine 04/01/2016    Past Surgical History:  Procedure Laterality Date  . ABDOMINAL HYSTERECTOMY    . BIOPSY BREAST    . PELVIC EXENTERATION      OB History    No data available       Home Medications    Prior to Admission medications   Medication Sig Start Date End Date Taking? Authorizing Provider  albuterol (PROVENTIL HFA;VENTOLIN HFA) 108 (90 BASE) MCG/ACT inhaler Inhale 2 puffs into the lungs every 6 (six) hours as needed for wheezing or shortness of breath. Reported on 05/05/2016    Historical Provider, MD  clonazePAM (KLONOPIN) 1 MG tablet Take 1-2 mg by mouth 2 (two) times daily. Reported on 05/05/2016    Historical Provider, MD  cyclobenzaprine (FLEXERIL) 10 MG tablet Take 1 tablet (10 mg total) by mouth 3 (three) times daily as needed for muscle spasms (and pain). 07/16/16   Clayton Bibles, PA-C  gabapentin (NEURONTIN) 600 MG tablet Take 1 tablet (600 mg total) by mouth 3 (three) times daily. 07/18/16   Rosemarie Ax, MD  ibuprofen  (ADVIL,MOTRIN) 600 MG tablet Take 1 tablet (600 mg total) by mouth every 8 (eight) hours as needed. 07/18/16   Rosemarie Ax, MD  lidocaine (LIDODERM) 5 % Place 1 patch onto the skin daily. Remove & Discard patch within 12 hours or as directed by MD 07/16/16   Clayton Bibles, PA-C  meloxicam (MOBIC) 15 MG tablet Take 1 tablet (15 mg total) by mouth daily. For pain Patient not taking: Reported on 08/18/2016 06/09/16   Alveda Reasons, MD  Multiple Vitamin (MULTIVITAMIN WITH MINERALS) TABS tablet Take 1 tablet by mouth every morning. Reported on 05/05/2016    Historical Provider, MD  omeprazole (PRILOSEC) 20 MG capsule Take 1 capsule (20 mg total) by mouth daily. 10/13/15   April Palumbo, MD  oxyCODONE-acetaminophen (PERCOCET) 10-325 MG tablet Take 1 tablet by mouth every 6 (six) hours as needed for pain. Patient not taking: Reported on 08/18/2016 06/15/16   Dorian Heckle English, PA  promethazine (PHENERGAN) 25 MG suppository Place 25 mg rectally every 6 (six) hours as needed for nausea or vomiting. Reported on 05/05/2016    Historical Provider, MD  promethazine (PHENERGAN) 25 MG tablet Take 25 mg by mouth every 6 (six) hours as needed for nausea or vomiting. Reported on 05/05/2016    Historical Provider, MD  rizatriptan (MAXALT) 10 MG tablet Take 10  mg by mouth daily as needed for migraine. Reported on 05/05/2016    Historical Provider, MD  topiramate (TOPAMAX) 50 MG tablet Take 150 mg by mouth daily. Reported on 05/05/2016    Historical Provider, MD  traMADol (ULTRAM) 50 MG tablet Take by mouth every 6 (six) hours as needed.    Historical Provider, MD    Family History Family History  Problem Relation Age of Onset  . Diabetes Other   . CAD Other     Social History Social History  Substance Use Topics  . Smoking status: Current Every Day Smoker    Packs/day: 1.00    Years: 14.00    Types: Cigarettes  . Smokeless tobacco: Current User  . Alcohol use No     Allergies   Morphine and related;  Prednisone; Seroquel [quetiapine fumerate]; and Thorazine [chlorpromazine hcl]   Review of Systems Review of Systems  Cardiovascular: Negative for chest pain.  Gastrointestinal: Negative for abdominal pain.  All other systems reviewed and are negative.    Physical Exam Updated Vital Signs BP 120/98   Pulse 85   Temp 98 F (36.7 C) (Oral)   Resp 17   SpO2 99%   Physical Exam  Constitutional: She is oriented to person, place, and time. She appears well-developed and well-nourished. No distress.  HENT:  Head: Normocephalic.  Nose: Nose normal.  Eyes: Conjunctivae are normal.  B/l mydriasis  Neck: Neck supple. No tracheal deviation present.  Cardiovascular: Normal rate, regular rhythm and normal heart sounds.   Pulmonary/Chest: Effort normal and breath sounds normal. No respiratory distress.  Abdominal: Soft. She exhibits no distension.  Neurological: She is alert and oriented to person, place, and time.  Skin: Skin is warm and dry.  Psychiatric: Her affect is blunt. Her speech is slurred. She is slowed and withdrawn. She expresses impulsivity. She exhibits a depressed mood. She expresses suicidal (states "I don't know why I'm here" and "I want to sleep so my pain will go away") ideation.  Vitals reviewed.    ED Treatments / Results  Labs (all labs ordered are listed, but only abnormal results are displayed) Labs Reviewed  RAPID URINE DRUG SCREEN, HOSP PERFORMED - Abnormal; Notable for the following:       Result Value   Benzodiazepines POSITIVE (*)    All other components within normal limits  ACETAMINOPHEN LEVEL - Abnormal; Notable for the following:    Acetaminophen (Tylenol), Serum <10 (*)    All other components within normal limits  COMPREHENSIVE METABOLIC PANEL  ETHANOL  CBC WITH DIFFERENTIAL/PLATELET  SALICYLATE LEVEL  POC URINE PREG, ED    EKG  EKG Interpretation  Date/Time:  Friday October 28 2016 18:18:29 EST Ventricular Rate:  88 PR Interval:      QRS Duration: 75 QT Interval:  362 QTC Calculation: 438 R Axis:   86 Text Interpretation:  Sinus rhythm Borderline short PR interval Nonspecific T abnrm, anterolateral leads Otherwise no significant change Confirmed by Lorenna Lurry MD, Rafan Sanders [id number]) on 10/28/2016 6:37:50 PM       Radiology No results found.  Procedures Procedures (including critical care time)  Medications Ordered in ED Medications  ibuprofen (ADVIL,MOTRIN) tablet 600 mg (not administered)  meloxicam (MOBIC) tablet 15 mg (not administered)  pantoprazole (PROTONIX) EC tablet 40 mg (not administered)  topiramate (TOPAMAX) tablet 150 mg (not administered)     Initial Impression / Assessment and Plan / ED Course  I have reviewed the triage vital signs and the nursing notes.  Pertinent  labs & imaging results that were available during my care of the patient were reviewed by me and considered in my medical decision making (see chart for details).  Clinical Course     45 year old female presents with overdose on Klonopin and gabapentin tonight. She tells me that she is in chronic pain that she could not take anymore and felt like she did not "want to be here anymore"and thought if she took enough of her medications she could just sleep and not be in pain any longer. I am highly concerned that this was a suicide attempt given her large ingestion and high-risk behavior with chronic pain condition. Patient placed under IVC pending psych assessment with concern for self-harm behavior.  Per poison control patient needs to be monitored for 8 hours to be medically clear. She has begun to clear her mental status, was given her home medications holding benzos and gabapentin for now as she metabolizes her overdose. MEDICALLY CLEAR FOR TRANSFER OR PSYCHIATRIC ADMISSION.    Final Clinical Impressions(s) / ED Diagnoses   Final diagnoses:  Intentional drug overdose, initial encounter [facility])    New Prescriptions New Prescriptions    No medications on file     Leo Grosser, MD 10/29/16 [id number]    Leo Grosser, MD 10/29/16 [phone]

## 2016-10-29 NOTE — BH Assessment (Signed)
[facility] at capacity. Faxed clinical information to the following failities for placement:  [facility]   [address] [place], [place], [facility], [facility] Triage Specialist [phone]

## 2016-10-29 NOTE — ED Notes (Signed)
Report to include Situation, Background, Assessment, and Recommendations received from [facility]. Patient alert and oriented, warm and dry, in no acute distress. Patient denies SI, HI, AVH. Patient made aware of Q15 minute rounds and security cameras for their safety. Patient instructed to come to me with needs or concerns.
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
## 2016-10-29 NOTE — ED Notes (Signed)
Pt refuses to wear the leads. Pt demands pain meds and is very upset
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
## 2016-10-29 NOTE — ED Notes (Signed)
Pt arrived to [facility] The Room 36 @ 1732 ambulatory and alert. Denied SI/HI/AVH. States her boyfriend had her IVC'd to get her monthly SSI check. Denies taking an overdosage of medication. Contracted for safety.

## 2016-10-29 NOTE — ED Notes (Signed)
Bed: [id number] Expected date: 10/28/16 Expected time:  Means of arrival:  Comments:

## 2016-10-29 NOTE — Tx Team (Signed)
Initial Treatment Plan 10/29/2016 10:51 PM [name] [id number]    PATIENT STRESSORS: Financial difficulties Marital or family conflict Medication change or noncompliance   PATIENT STRENGTHS: Average or above average intelligence Capable of independent living General fund of knowledge   PATIENT IDENTIFIED PROBLEMS: "help with my pain"  "help with depression"                   DISCHARGE CRITERIA:  Improved stabilization in mood, thinking, and/or behavior Need for constant or close observation no longer present Verbal commitment to aftercare and medication compliance  PRELIMINARY DISCHARGE PLAN: Attend aftercare/continuing care group Return to previous living arrangement  PATIENT/FAMILY INVOLVEMENT: This treatment plan has been presented to and reviewed with the patient, [name], and/or family member,  The patient and family have been given the opportunity to ask questions and make suggestions.  Migdalia Dk, RN 10/29/2016, 10:51 PM

## 2016-10-29 NOTE — ED Notes (Signed)
Hourly rounding reveals patient in room. Stable, in no acute distress. Q15 minute rounds and monitoring via Security Cameras to continue. 

## 2016-10-29 NOTE — ED Notes (Signed)
[facility] tts- recommendation is in patient tx,  Full at behavioral health currently most likely have some d/c and can get her in tomorrow afternoon.

## 2016-10-30 ENCOUNTER — Encounter (HOSPITAL_COMMUNITY): Payer: Self-pay | Admitting: Psychiatry

## 2016-10-30 DIAGNOSIS — Z888 Allergy status to other drugs, medicaments and biological substances status: Secondary | ICD-10-CM

## 2016-10-30 DIAGNOSIS — F1320 Sedative, hypnotic or anxiolytic dependence, uncomplicated: Secondary | ICD-10-CM

## 2016-10-30 DIAGNOSIS — Z79899 Other long term (current) drug therapy: Secondary | ICD-10-CM

## 2016-10-30 DIAGNOSIS — Z808 Family history of malignant neoplasm of other organs or systems: Secondary | ICD-10-CM

## 2016-10-30 DIAGNOSIS — M797 Fibromyalgia: Secondary | ICD-10-CM

## 2016-10-30 DIAGNOSIS — Z818 Family history of other mental and behavioral disorders: Secondary | ICD-10-CM

## 2016-10-30 DIAGNOSIS — G894 Chronic pain syndrome: Secondary | ICD-10-CM | POA: Diagnosis present

## 2016-10-30 DIAGNOSIS — Z833 Family history of diabetes mellitus: Secondary | ICD-10-CM

## 2016-10-30 DIAGNOSIS — F4323 Adjustment disorder with mixed anxiety and depressed mood: Principal | ICD-10-CM

## 2016-10-30 MED ORDER — LORAZEPAM 1 MG PO TABS: 1.0000 mg | ORAL_TABLET | Freq: Four times a day (QID) | ORAL | Status: DC | PRN

## 2016-10-30 MED ORDER — CLONAZEPAM 1 MG PO TABS: 1.0000 mg | ORAL_TABLET | Freq: Every day | ORAL | Status: DC

## 2016-10-30 MED ORDER — LORAZEPAM 1 MG PO TABS: 1.0000 mg | ORAL_TABLET | Freq: Three times a day (TID) | ORAL | Status: DC

## 2016-10-30 MED ORDER — METHOCARBAMOL 500 MG PO TABS: 1000.0000 mg | ORAL_TABLET | Freq: Four times a day (QID) | ORAL | Status: DC | PRN

## 2016-10-30 MED ORDER — LOPERAMIDE HCL 2 MG PO CAPS: 2.0000 mg | ORAL_CAPSULE | ORAL | Status: DC | PRN

## 2016-10-30 MED ORDER — ONDANSETRON 4 MG PO TBDP: 4.0000 mg | ORAL_TABLET | Freq: Four times a day (QID) | ORAL | Status: DC | PRN

## 2016-10-30 MED ORDER — LIDOCAINE 5 % EX PTCH
1.0000 | MEDICATED_PATCH | CUTANEOUS | Status: DC
  Administered 2016-10-30 – 2016-10-31 (×2): 1 via TRANSDERMAL
  Filled 2016-10-30 (×3): qty 1

## 2016-10-30 MED ORDER — VITAMIN B-1 100 MG PO TABS
100.0000 mg | ORAL_TABLET | Freq: Every day | ORAL | Status: DC
  Administered 2016-10-31: 100 mg via ORAL
  Filled 2016-10-30: qty 7
  Filled 2016-10-30 (×2): qty 1

## 2016-10-30 MED ORDER — GABAPENTIN 300 MG PO CAPS
600.0000 mg | ORAL_CAPSULE | Freq: Three times a day (TID) | ORAL | Status: DC
  Administered 2016-10-30 – 2016-10-31 (×3): 600 mg via ORAL
  Filled 2016-10-30 (×5): qty 2
  Filled 2016-10-30 (×3): qty 42

## 2016-10-30 MED ORDER — METHOCARBAMOL 750 MG PO TABS
1500.0000 mg | ORAL_TABLET | Freq: Once | ORAL | Status: AC
  Administered 2016-10-30: 1500 mg via ORAL
  Filled 2016-10-30 (×2): qty 2

## 2016-10-30 MED ORDER — LORAZEPAM 1 MG PO TABS: 1.0000 mg | ORAL_TABLET | Freq: Every day | ORAL | Status: DC

## 2016-10-30 MED ORDER — DULOXETINE HCL 20 MG PO CPEP
40.0000 mg | ORAL_CAPSULE | Freq: Every day | ORAL | Status: DC
  Administered 2016-10-30: 40 mg via ORAL
  Administered 2016-10-30: 30 mg via ORAL
  Administered 2016-10-31: 40 mg via ORAL
  Filled 2016-10-30 (×2): qty 2
  Filled 2016-10-30: qty 14
  Filled 2016-10-30 (×2): qty 2

## 2016-10-30 MED ORDER — LORAZEPAM 1 MG PO TABS: 1.0000 mg | ORAL_TABLET | Freq: Two times a day (BID) | ORAL | Status: DC

## 2016-10-30 MED ORDER — LIDOCAINE 5 % EX PTCH
MEDICATED_PATCH | CUTANEOUS | Status: AC
  Filled 2016-10-30: qty 1

## 2016-10-30 MED ORDER — KETOROLAC TROMETHAMINE 60 MG/2ML IM SOLN
60.0000 mg | Freq: Once | INTRAMUSCULAR | Status: AC
Start: 2016-10-30 — End: 2016-10-30
  Administered 2016-10-30: 60 mg via INTRAMUSCULAR
  Filled 2016-10-30 (×2): qty 2

## 2016-10-30 MED ORDER — CYCLOBENZAPRINE HCL 5 MG PO TABS
7.5000 mg | ORAL_TABLET | Freq: Three times a day (TID) | ORAL | Status: DC | PRN
  Administered 2016-10-30 – 2016-10-31 (×4): 7.5 mg via ORAL
  Filled 2016-10-30 (×3): qty 2

## 2016-10-30 MED ORDER — LORAZEPAM 1 MG PO TABS: 1.0000 mg | ORAL_TABLET | Freq: Four times a day (QID) | ORAL | Status: DC

## 2016-10-30 MED ORDER — DULOXETINE HCL 30 MG PO CPEP
ORAL_CAPSULE | ORAL | Status: AC
  Administered 2016-10-30: 30 mg via ORAL
  Filled 2016-10-30: qty 1

## 2016-10-30 MED ORDER — CYCLOBENZAPRINE HCL 10 MG PO TABS
ORAL_TABLET | ORAL | Status: AC
  Administered 2016-10-30: 13:00:00
  Filled 2016-10-30: qty 1

## 2016-10-30 MED ORDER — CLONAZEPAM 1 MG PO TABS
2.0000 mg | ORAL_TABLET | Freq: Every day | ORAL | Status: DC
  Administered 2016-10-30: 2 mg via ORAL
  Filled 2016-10-30: qty 2
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
## 2016-10-30 NOTE — BHH Suicide Risk Assessment (Signed)
[facility] Admission Suicide Risk Assessment   Nursing information obtained from:    Demographic factors:    Current Mental Status:    Loss Factors:    Historical Factors:    Risk Reduction Factors:     Total Time spent with patient: 30 minutes Principal Problem: Adjustment disorder with mixed anxiety and depressed mood Diagnosis:   Patient Active Problem List   Diagnosis Date Noted  . Adjustment disorder with mixed anxiety and depressed mood [F43.23] 10/30/2016  . Fibromyalgia [M79.7] 10/30/2016  . Chronic pain syndrome [G89.4] 10/30/2016  . Benzodiazepine dependence ([place]) [F13.20] 10/30/2016  . Intentional drug overdose ([place]) [T50.902A]   . Pain [R52] 07/18/2016  . Low back pain [M54.5] 05/29/2016  . Headache, migraine [G43.909] 04/01/2016   Subjective Data: Patient reports she took a few more klonopin than usual in an attempt to sleep - has severe pain - could not get her pain medications due to financial issues . CSW to get collateral information. Pt denies past hx of diagnosis of mental illness.  Continued Clinical Symptoms:  Alcohol Use Disorder Identification Test Final Score (AUDIT): 0 The "Alcohol Use Disorders Identification Test", Guidelines for Use in Primary Care, Second Edition.  World Pharmacologist [facility]). Score between 0-7:  no or low risk or alcohol related problems. Score between 8-15:  moderate risk of alcohol related problems. Score between 16-19:  high risk of alcohol related problems. Score 20 or above:  warrants further diagnostic evaluation for alcohol dependence and treatment.   CLINICAL FACTORS:   Severe Anxiety and/or Agitation Chronic Pain   Musculoskeletal: Strength & Muscle Tone: within normal limits Gait & Station: normal Patient leans: N/A  Psychiatric Specialty Exam: Physical Exam  Review of Systems  Musculoskeletal: Positive for back pain.  Psychiatric/Behavioral: The patient is nervous/anxious.   All other systems reviewed and are  negative.   Blood pressure 112/89, pulse 98, temperature 98.2 F (36.8 C), temperature source Oral, resp. rate 18, height 5' 4.25" (1.632 m), weight 67.6 kg (149 lb).Body mass index is 25.38 kg/m.  General Appearance: Casual  Eye Contact:  Fair  Speech:  Clear and Coherent  Volume:  Normal  Mood:  Anxious  Affect:  Appropriate  Thought Process:  Goal Directed and Descriptions of Associations: Intact  Orientation:  Full (Time, Place, and Person)  Thought Content:  Logical  Suicidal Thoughts:  No  Homicidal Thoughts:  No  Memory:  Immediate;   Fair Recent;   Fair Remote;   Fair  Judgement:  Fair  Insight:  Fair  Psychomotor Activity:  Normal  Concentration:  Concentration: Fair and Attention Span: Fair  Recall:  [employer] of Knowledge:  Fair  Language:  Fair  Akathisia:  No  Handed:  Right  AIMS (if indicated):     Assets:  Desire for Improvement  ADL's:  Intact  Cognition:  WNL  Sleep:  Number of Hours: 4      COGNITIVE FEATURES THAT CONTRIBUTE TO RISK:  Closed-mindedness, Polarized thinking and Thought constriction (tunnel vision)    SUICIDE RISK:   Mild:  Suicidal ideation of limited frequency, intensity, duration, and specificity.  There are no identifiable plans, no associated intent, mild dysphoria and related symptoms, good self-control (both objective and subjective assessment), few other risk factors, and identifiable protective factors, including available and accessible social support.   PLAN OF CARE: Case discussed with NP. Please see H&P. Reviewed [place] controlled substance database - Pt is on Klonopin - verified - will restart home dose.  I certify that inpatient services furnished can reasonably be expected to improve the patient's condition.  Zailynn Brandel, MD 10/30/2016, 11:12 AM

## 2016-10-30 NOTE — Progress Notes (Signed)
Patient adamant that she has " dead leg" upon returning to unit after dinner. She is given her 2nd dose of flexeril 7.5 mg po and says " it didn't do any good" 20 min later. She is observed moaning, groaning, pressing her fingertips deep into her groin, appears to be pressing into pubic bone.  She standing at the med window and says " I think I'm going to fall. This is what happens when I lose the feeling in my leg..." .NP contacted and one time dose of toradol im order received, as well as to to place pt on high fall risk and give toradol x 1 and place pt in wheelchair.

## 2016-10-30 NOTE — Progress Notes (Signed)
Adult Psychoeducational Group Note  Date:  10/30/2016 Time:  8:47 PM  Group Topic/Focus:  Wrap-Up Group:   The focus of this group is to help patients review their daily goal of treatment and discuss progress on daily workbooks.   Participation Level:  Active  Participation Quality:  Appropriate  Affect:  Appropriate  Cognitive:  Appropriate  Insight: Appropriate  Engagement in Group:  Engaged  Modes of Intervention:  Discussion  Additional Comments:  Pt stated she had an okay day. Pt states she has been in pain today. Clint Bolder 10/30/2016, 8:47 PM

## 2016-10-30 NOTE — BHH Group Notes (Signed)
[facility] Group Notes: (Clinical Social Work)   10/30/2016      Type of Therapy:  Group Therapy   Participation Level:  Did Not Attend - moved to [place], [place] 10/30/2016, 3:41 PM
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
## 2016-10-30 NOTE — Progress Notes (Signed)
Pt did go back to sleep about an hour after receiving the medication

## 2016-10-30 NOTE — Progress Notes (Signed)
Pt woke up a few minutes ago crying and saying she could not move her back to roll over  Pt is in one of the bunk beds that is flat and cannot be adjusted    She said the bed is what is causing her back to hurt more   Pt was offered a reclining bedchair from the obs unit but she refused it   Orders received from provider and medications administered and effectiveness monitored   Pt requested to get ativan but this writer said to wait to see how the muscle relaxer worked as it was a substantial dose and staff didn't want her to fall if she tried to get up  Pt reluctantly agreed to wait for the muscle relaxer to start working

## 2016-10-30 NOTE — Progress Notes (Signed)
D Daena is OOB UAL on the [address] hall today...tolerated fair. She is loud, abrasive and anxious when she comes to this nurse and says " where are my meds..I  need my meds? Have you got my meds yet?". She is anxious, she is irritable and - she is adamant that she be restarted on klonopin 2mg  po qd " like ive been taking for a long time.at home'. Initially, NP unable to confirm this medication in online reigstry, but different spelliing used for pt's last name ( correct spelling used) and klonopin order showed up . NP restarted klonopin 2mg  po qhs order and pt made aware. A Pt also given 7.5 flexeril , lidoderm pain patch and cymbalta per NP order by this Probation officer. Pt inconsistent with c/o back discomfort. She is unable to remember / identify when she sustained back injury ( and /or exactly what back injury is) and she cannot remember who ordered / started her on klonopin. Pt completed daily assessment and on it she wrote she deneid SI and she rated her depression, hopelessness and anxiety " 0/0/0/", respectively. R Safety in place.

## 2016-10-30 NOTE — BHH Suicide Risk Assessment (Addendum)
[place] INPATIENT:  Family/Significant Other Suicide Prevention Education  Suicide Prevention Education:  Contact Attempts: Zeb Comfort - friend [phone]), (name of family member/significant other) has been identified by the patient as the family member/significant other with whom the patient will be residing, and identified as the person(s) who will aid the patient in the event of a mental health crisis.  With written consent from the patient, two attempts were made to provide suicide prevention education, prior to and/or following the patient's discharge.  We were unsuccessful in providing suicide prevention education.  A suicide education pamphlet was given to the patient to share with family/significant other.  Date and time of first attempt: 10/30/16 @ 1:36 pm - discreet voicemail message left Date and time of second attempt: 10/31/16 at 8:00 AM, SPE completed by Jolyn Lent 10/30/2016, 1:35 PM   CSW spoke w Zeb Comfort, friend of patient who lives in [place]. Is willing for patient to relocate and live w him "if that's what she wants", "I am confused about the whole situation, she is discharging with the guy she is living with and its a misunderstanding about what was actually going on."  Has known patient since childhood, states "I have never known of her having any issues like this before."  Lives in [place] ([place] Per [place], patient has extended family in [place] who would be supportive.  Per friend, patient does not have history of abuse of substances, "she takes only the medications she needs for her medical condition."  States patient will need to find her own transportation to his home in [place] if she decides to relocate after discharge.  Reviewed SPE, friend states there are no weapons or unsecured medications in his home.  Is not familiar w patient's current living conditions w boyfriend in  [place] area so cannot address concerns at that location.  CSW will advise patient to contact Mr Jacqualine Code if she desires to relocate to his home after discharge.

## 2016-10-30 NOTE — Progress Notes (Signed)
Pt has been in and out of the wheelchair this evening.  She reports that she has pain level 7/10 after receiving Flexeril shortly before shift change (1811).  Pt denies SI/HI/AVH at this time.  She denies any other concerns other than her pain, and receiving meds when they are due.  Pt requested that writer wake her up to get another dose of Flexeril during the night.  Pt was told that she would receive the med when writer came in to get her lidocaine patch per orders.  Pt in agreement.  Pt has been appropriate this evening.  She was observed bending over without any signs of pain or discomfort when talking to a peer who was in a wheelchair.  Support and encouragement offered.  Discharge plans are in process.  Safety maintained with q15 minute checks.

## 2016-10-31 MED ORDER — CLONAZEPAM 2 MG PO TABS: 2.0000 mg | ORAL_TABLET | Freq: Every day | ORAL | 0 refills | Status: DC

## 2016-10-31 MED ORDER — TOPIRAMATE 100 MG PO TABS
150.0000 mg | ORAL_TABLET | Freq: Every day | ORAL | Status: DC
  Filled 2016-10-31: qty 11

## 2016-10-31 MED ORDER — NICOTINE 21 MG/24HR TD PT24: 21.0000 mg | MEDICATED_PATCH | Freq: Every day | TRANSDERMAL | 0 refills | Status: AC

## 2016-10-31 MED ORDER — TOPIRAMATE 50 MG PO TABS: 150.0000 mg | ORAL_TABLET | Freq: Every day | ORAL | 0 refills | Status: AC

## 2016-10-31 MED ORDER — GABAPENTIN 300 MG PO CAPS: 600.0000 mg | ORAL_CAPSULE | Freq: Three times a day (TID) | ORAL | 0 refills | Status: DC

## 2016-10-31 MED ORDER — DULOXETINE HCL 40 MG PO CPEP: 40.0000 mg | ORAL_CAPSULE | Freq: Every day | ORAL | 0 refills | Status: AC

## 2016-10-31 MED ORDER — THIAMINE HCL 100 MG PO TABS: 100.0000 mg | ORAL_TABLET | Freq: Every day | ORAL | 0 refills | Status: AC

## 2016-10-31 NOTE — BHH Suicide Risk Assessment (Signed)
[facility] Discharge Suicide Risk Assessment   Principal Problem: Adjustment disorder with mixed anxiety and depressed mood Discharge Diagnoses:  Patient Active Problem List   Diagnosis Date Noted  . Adjustment disorder with mixed anxiety and depressed mood [F43.23] 10/30/2016  . Fibromyalgia [M79.7] 10/30/2016  . Chronic pain syndrome [G89.4] 10/30/2016  . Benzodiazepine dependence ([place]) [F13.20] 10/30/2016  . Intentional drug overdose ([place]) [T50.902A]   . Pain [R52] 07/18/2016  . Low back pain [M54.5] 05/29/2016  . Headache, migraine [G43.909] 04/01/2016    Total Time spent with patient: 30 minutes  Musculoskeletal: Strength & Muscle Tone: within normal limits Gait & Station: normal Patient leans: N/A  Psychiatric Specialty Exam: Review of Systems  Psychiatric/Behavioral: Negative for depression and hallucinations.  All other systems reviewed and are negative.   Blood pressure 101/79, pulse 88, temperature 97.7 F (36.5 C), temperature source Oral, resp. rate 18, height 5' 4.25" (1.632 m), weight 67.6 kg (149 lb).Body mass index is 25.38 kg/m.  General Appearance: Casual  Eye Contact::  Fair  Speech:  Clear and Coherent409  Volume:  Normal  Mood:  Euthymic  Affect:  Appropriate  Thought Process:  Goal Directed and Descriptions of Associations: Intact  Orientation:  Full (Time, Place, and Person)  Thought Content:  Logical  Suicidal Thoughts:  No  Homicidal Thoughts:  No  Memory:  Immediate;   Fair Recent;   Fair Remote;   Fair  Judgement:  Fair  Insight:  Fair  Psychomotor Activity:  Normal  Concentration:  Fair  Recall:  [employer] of Knowledge:Fair  Language: Fair  Akathisia:  No  Handed:  Right  AIMS (if indicated):     Assets:  Communication Skills Desire for Improvement  Sleep:  Number of Hours: 6.5  Cognition: WNL  ADL's:  Intact   Mental Status Per Nursing Assessment::   On Admission:     Demographic Factors:  Caucasian  Loss  Factors: NA  Historical Factors: Impulsivity  Risk Reduction Factors:   Positive social support  Continued Clinical Symptoms:  Previous Psychiatric Diagnoses and Treatments  Cognitive Features That Contribute To Risk:  None    Suicide Risk:  Minimal: No identifiable suicidal ideation.  Patients presenting with no risk factors but with morbid ruminations; may be classified as minimal risk based on the severity of the depressive symptoms  Follow-up Information    [facility]. Go in 3 day(s).   Specialty:  Behavioral Health Why:  Please use [facility] to establish for services for medications management and therapy.  Hours are Mon-Fri from 8:30 - 3.  Arrive early for prompt service and be prepared to spend several hours for initial evaluation process.  Contact information: [place] [address] [phone]           Plan Of Care/Follow-up recommendations:  Activity:  NO RESTRICTIONS Diet:  REGULAR Other:  FOLLOW UP WITH AFTER CARE, PMD, PAIN MANAGEMENT  Larissa Pegg, MD 10/31/2016, 9:39 AM
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
## 2016-10-31 NOTE — Progress Notes (Signed)
  [facility] Adult Case Management Discharge Plan :  Will you be returning to the same living situation after discharge:  Yes,  current boyfriend, plans to relocate in future to [place] At discharge, do you have transportation home?: Yes,  boyfriend Do you have the ability to pay for your medications: No. Referred to providers who can assist as needed  Release of information consent forms completed and in the chart;  Patient's signature needed at discharge.  Patient to Follow up at: Follow-up Information    [facility]. Go in 3 day(s).   Specialty:  Behavioral Health Why:  Please use [facility] to establish for services for medications management and therapy.  Hours are Mon-Fri from 8:30 - 3.  Arrive early for prompt service and be prepared to spend several hours for initial evaluation process.  Contact information: [place] [address] [phone]           Next level of care provider has access to [facility] and Suicide Prevention discussed: Yes,  friend Zeb Comfort  Have you used any form of tobacco in the last 30 days? (Cigarettes, Smokeless Tobacco, Cigars, and/or Pipes): Yes  Has patient been referred to the Quitline?: Patient refused referral  Patient has been referred for addiction treatment: Yes  Beverely Pace 10/31/2016, 9:48 AM

## 2016-10-31 NOTE — Clinical Social Work Note (Signed)
  Patient states she plans to go to [facility] for pain medications upon discharge.  Patient provided w contact information for this facility.  Edwyna Shell, LCSW Lead Clinical Social Worker Phone:  [phone]

## 2016-10-31 NOTE — Progress Notes (Signed)
Patient ID: [name], female   DOB: 1971/09/05, 46 y.o.   MRN: [id number]  Pt. Denies SI/HI and A/V hallucinations. She reports sleep is good, appetite is fair, energy level is normal, and concentration is good. She rates depression, hopelessness, and anxiety 0/10. Belongings returned to patient at time of discharge. Discharge instructions and medications were reviewed with patient. Patient verbalized understanding of both medications and discharge instructions. Patient discharged to lobby where ride was waiting. No distress upon discharge.

## 2016-10-31 NOTE — BHH Group Notes (Signed)
  [facility] LCSW Group Therapy  10/31/2016 1:28 PM  Type of Therapy:  Group Therapy  Participation Level:  Active  Participation Quality:  Appropriate  Affect:  Appropriate  Cognitive:  Appropriate  Insight:  Developing/Improving  Engagement in Therapy:  Engaged  Modes of Intervention:  Discussion, Exploration, Problem-solving and Rapport Building  Summary of Progress/Problems:  Patients discussed stresses and strengths related to discharge.  Identified concerns re discharge and processed feelings related to discharge.   Pt expressed desire to be a "helper" to others, was dismissive of concerns of other patients re suicidality "I dont know how anyone could hurt themselves when they  Have children."  Expressed continued concern over getting medication for chronic pain, feels she was hospitalized inappropriately.    Beverely Pace 10/31/2016, 1:28 PM

## 2016-10-31 NOTE — Clinical Social Work Note (Signed)
Patient given information on [facility] for behavioral health services.  Patient plans to relocate to [place] at undetermined future date.  Will access [facility] for immediate mental health needs.   Edwyna Shell, LCSW Lead Clinical Social Worker Phone:  [phone]

## 2016-10-31 NOTE — Tx Team (Signed)
Interdisciplinary Treatment and Diagnostic Plan Update  10/31/2016 Time of Session: 10:00 AM [name] MRN: [id number]  Principal Diagnosis: Adjustment disorder with mixed anxiety and depressed mood  Secondary Diagnoses: Principal Problem:   Adjustment disorder with mixed anxiety and depressed mood Active Problems:   Fibromyalgia   Chronic pain syndrome   Benzodiazepine dependence ([place])   Current Medications:  Current Facility-Administered Medications  Medication Dose Route Frequency Provider Last Rate Last Dose  . acetaminophen (TYLENOL) tablet 650 mg  650 mg Oral Q6H PRN Patrecia Pour, NP   650 mg at 10/30/16 0748  . alum & mag hydroxide-simeth (MAALOX/MYLANTA) 200-200-20 MG/5ML suspension 30 mL  30 mL Oral Q4H PRN Patrecia Pour, NP      . clonazePAM Bobbye [place]) tablet 2 mg  2 mg Oral QHS Kerrie Buffalo, NP   2 mg at 10/30/16 2046  . cyclobenzaprine (FLEXERIL) tablet 7.5 mg  7.5 mg Oral TID PRN Kerrie Buffalo, NP   7.5 mg at 10/31/16 0850  . DULoxetine (CYMBALTA) DR capsule 40 mg  40 mg Oral Daily Kerrie Buffalo, NP   40 mg at 10/31/16 0816  . folic acid (FOLVITE) tablet 1 mg  1 mg Oral Daily Patrecia Pour, NP   1 mg at 10/31/16 0817  . gabapentin (NEURONTIN) capsule 600 mg  600 mg Oral TID Kerrie Buffalo, NP   600 mg at 10/31/16 0817  . ibuprofen (ADVIL,MOTRIN) tablet 600 mg  600 mg Oral Q6H PRN Patrecia Pour, NP   600 mg at 10/30/16 2046  . lidocaine (LIDODERM) 5 % 1 patch  1 patch Transdermal Q24H Kerrie Buffalo, NP   1 patch at 10/30/16 1306  . loperamide (IMODIUM) capsule 2-4 mg  2-4 mg Oral PRN Ursula Alert, MD      . magnesium hydroxide (MILK OF MAGNESIA) suspension 30 mL  30 mL Oral Daily PRN Patrecia Pour, NP      . multivitamin with minerals tablet 1 tablet  1 tablet Oral Daily Patrecia Pour, NP   1 tablet at 10/31/16 0816  . nicotine (NICODERM CQ - dosed in mg/24 hours) patch 21 mg  21 mg Transdermal Daily Rozetta Nunnery, NP   21 mg at 10/31/16 0816  .  ondansetron (ZOFRAN-ODT) disintegrating tablet 4 mg  4 mg Oral Q6H PRN Ursula Alert, MD      . thiamine (VITAMIN B-1) tablet 100 mg  100 mg Oral Daily Ursula Alert, MD   100 mg at 10/31/16 0817  . topiramate (TOPAMAX) tablet 150 mg  150 mg Oral Daily Patrecia Pour, NP   150 mg at 10/31/16 [id number]   PTA Medications: Prescriptions Prior to Admission  Medication Sig Dispense Refill Last Dose  . albuterol (PROVENTIL HFA;VENTOLIN HFA) 108 (90 BASE) MCG/ACT inhaler Inhale 2 puffs into the lungs every 6 (six) hours as needed for wheezing or shortness of breath. Reported on 05/05/2016   Past Month at Unknown time  . amitriptyline (ELAVIL) 50 MG tablet Take 50 mg by mouth at bedtime as needed for sleep.   10/28/2016 at Unknown time  . ibuprofen (ADVIL,MOTRIN) 200 MG tablet Take 600 mg by mouth every 6 (six) hours as needed.     Marland Kitchen ibuprofen (ADVIL,MOTRIN) 600 MG tablet Take 1 tablet (600 mg total) by mouth every 8 (eight) hours as needed. 30 tablet 1 Past Week at Unknown time  . lidocaine (LIDODERM) 5 % Place 1 patch onto the skin daily. Remove & Discard patch within 12 hours or  as directed by MD (Patient not taking: Reported on 10/29/2016) 30 patch 0 Not Taking at Unknown time  . meloxicam (MOBIC) 15 MG tablet Take 1 tablet (15 mg total) by mouth daily. For pain (Patient not taking: Reported on 10/29/2016) 30 tablet 0 Not Taking at Unknown time  . Multiple Vitamin (MULTIVITAMIN WITH MINERALS) TABS tablet Take 1 tablet by mouth every morning. Reported on 05/05/2016   10/28/2016 at Unknown time  . omeprazole (PRILOSEC) 20 MG capsule Take 1 capsule (20 mg total) by mouth daily. (Patient not taking: Reported on 10/29/2016) 30 capsule 0 Not Taking at Unknown time  . rizatriptan (MAXALT) 10 MG tablet Take 10 mg by mouth daily as needed for migraine. Reported on 05/05/2016   Past Week at Unknown time  . topiramate (TOPAMAX) 50 MG tablet Take 150 mg by mouth daily. Reported on 05/05/2016   Past Month at Unknown time     Patient Stressors: Financial difficulties Marital or family conflict Medication change or noncompliance  Patient Strengths: Average or above average intelligence Capable of independent living General fund of knowledge  Treatment Modalities: Medication Management, Group therapy, Case management,  1 to 1 session with clinician, Psychoeducation, Recreational therapy.   Physician Treatment Plan for Primary Diagnosis: Adjustment disorder with mixed anxiety and depressed mood Long Term Goal(s): Improvement in symptoms so as ready for discharge Improvement in symptoms so as ready for discharge   Short Term Goals: Ability to identify changes in lifestyle to reduce recurrence of condition will improve Ability to verbalize feelings will improve Ability to demonstrate self-control will improve Ability to identify and develop effective coping behaviors will improve Ability to maintain clinical measurements within normal limits will improve Compliance with prescribed medications will improve Ability to identify triggers associated with substance abuse/mental health issues will improve Ability to identify changes in lifestyle to reduce recurrence of condition will improve Ability to verbalize feelings will improve Ability to disclose and discuss suicidal ideas Ability to demonstrate self-control will improve Ability to identify and develop effective coping behaviors will improve Ability to maintain clinical measurements within normal limits will improve Compliance with prescribed medications will improve Ability to identify triggers associated with substance abuse/mental health issues will improve  Medication Management: Evaluate patient's response, side effects, and tolerance of medication regimen.  Therapeutic Interventions: 1 to 1 sessions, Unit Group sessions and Medication administration.  Evaluation of Outcomes: Adequate for Discharge  Physician Treatment Plan for Secondary  Diagnosis: Principal Problem:   Adjustment disorder with mixed anxiety and depressed mood Active Problems:   Fibromyalgia   Chronic pain syndrome   Benzodiazepine dependence ([place])  Long Term Goal(s): Improvement in symptoms so as ready for discharge Improvement in symptoms so as ready for discharge   Short Term Goals: Ability to identify changes in lifestyle to reduce recurrence of condition will improve Ability to verbalize feelings will improve Ability to demonstrate self-control will improve Ability to identify and develop effective coping behaviors will improve Ability to maintain clinical measurements within normal limits will improve Compliance with prescribed medications will improve Ability to identify triggers associated with substance abuse/mental health issues will improve Ability to identify changes in lifestyle to reduce recurrence of condition will improve Ability to verbalize feelings will improve Ability to disclose and discuss suicidal ideas Ability to demonstrate self-control will improve Ability to identify and develop effective coping behaviors will improve Ability to maintain clinical measurements within normal limits will improve Compliance with prescribed medications will improve Ability to identify triggers associated with substance  abuse/mental health issues will improve     Medication Management: Evaluate patient's response, side effects, and tolerance of medication regimen.  Therapeutic Interventions: 1 to 1 sessions, Unit Group sessions and Medication administration.  Evaluation of Outcomes: Adequate for Discharge   RN Treatment Plan for Primary Diagnosis: Adjustment disorder with mixed anxiety and depressed mood Long Term Goal(s): Knowledge of disease and therapeutic regimen to maintain health will improve  Short Term Goals: Ability to verbalize feelings will improve, Ability to disclose and discuss suicidal ideas and Ability to identify and develop  effective coping behaviors will improve  Medication Management: RN will administer medications as ordered by provider, will assess and evaluate patient's response and provide education to patient for prescribed medication. RN will report any adverse and/or side effects to prescribing provider.  Therapeutic Interventions: 1 on 1 counseling sessions, Psychoeducation, Medication administration, Evaluate responses to treatment, Monitor vital signs and CBGs as ordered, Perform/monitor CIWA, COWS, AIMS and Fall Risk screenings as ordered, Perform wound care treatments as ordered.  Evaluation of Outcomes: Adequate for Discharge   LCSW Treatment Plan for Primary Diagnosis: Adjustment disorder with mixed anxiety and depressed mood Long Term Goal(s): Safe transition to appropriate next level of care at discharge, Engage patient in therapeutic group addressing interpersonal concerns.  Short Term Goals: Engage patient in aftercare planning with referrals and resources, Increase ability to appropriately verbalize feelings, Increase emotional regulation and Increase skills for wellness and recovery  Therapeutic Interventions: Assess for all discharge needs, 1 to 1 time with Social worker, Explore available resources and support systems, Assess for adequacy in community support network, Educate family and significant other(s) on suicide prevention, Complete Psychosocial Assessment, Interpersonal group therapy.  Evaluation of Outcomes: Adequate for Discharge   Progress in Treatment: Attending groups: No. and As evidenced by:  did not attend groups on 12/31 Participating in groups: No. Taking medication as prescribed: Yes. Toleration medication: Yes. Family/Significant other contact made: Yes, individual(s) contacted:  Zeb Comfort, friend Patient understands diagnosis: Yes. Discussing patient identified problems/goals with staff: Yes. Medical problems stabilized or resolved: Yes.  Patient concerned about  continuing ability to access Flexoril, states this has been effective for pain control, plans to go to [facility] at discharge Denies suicidal/homicidal ideation: Yes. Issues/concerns per patient self-inventory: No. Other: none  New problem(s) identified: No, Describe:  none at this time  New Short Term/Long Term Goal(s):   Patient plans to relocate to [place] after initial appointment w neurologist 1.8.18,  Patient has information on this provider and has self scheduled for pain control  Discharge Plan or Barriers: none at this time  Reason for Continuation of Hospitalization: Medication stabilization  Estimated Length of Stay:  DC anticipated today  Attendees: Patient: 10/31/2016 9:49 AM  Physician: Charlcie Cradle MD 10/31/2016 9:49 AM  Nursing: Lyndee Leo RN, Jacklyn Shell RN 10/31/2016 9:49 AM  RN Care Manager: 10/31/2016 9:49 AM  Social Worker: Eusebio Me LCSW 10/31/2016 9:49 AM  Recreational Therapist:  10/31/2016 9:49 AM  Other:  10/31/2016 9:49 AM  Other:  10/31/2016 9:49 AM  Other: 10/31/2016 9:49 AM    Scribe for Treatment Team: Beverely Pace, LCSW 10/31/2016 9:49 AM

## 2016-11-01 ENCOUNTER — Encounter: Payer: Self-pay | Admitting: Physician Assistant

## 2016-11-01 ENCOUNTER — Ambulatory Visit (INDEPENDENT_AMBULATORY_CARE_PROVIDER_SITE_OTHER): Payer: Self-pay | Admitting: Physician Assistant

## 2016-11-01 VITALS — BP 110/70 | HR 86 | Temp 98.5°F | Resp 16 | Ht 65.25 in | Wt 151.4 lb

## 2016-11-01 DIAGNOSIS — G894 Chronic pain syndrome: Secondary | ICD-10-CM

## 2016-11-01 MED ORDER — GABAPENTIN 300 MG PO CAPS: 900.0000 mg | ORAL_CAPSULE | Freq: Three times a day (TID) | ORAL | 0 refills | Status: AC

## 2016-11-01 MED ORDER — TRAMADOL HCL 50 MG PO TABS: 50.0000 mg | ORAL_TABLET | Freq: Three times a day (TID) | ORAL | 0 refills | Status: DC | PRN

## 2016-11-01 MED ORDER — CYCLOBENZAPRINE HCL 10 MG PO TABS: 10.0000 mg | ORAL_TABLET | Freq: Three times a day (TID) | ORAL | 0 refills | Status: AC | PRN
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
## 2016-11-01 NOTE — Patient Instructions (Addendum)
Increase the gabapentin from 600 mg to 900 mg, continue 3 times each day.    IF you received an x-ray today, you will receive an invoice from [facility]. Please contact [facility] at [phone] with questions or concerns regarding your invoice.   IF you received labwork today, you will receive an invoice from [facility]. Please contact [facility] at [phone] with questions or concerns regarding your invoice.   Our billing staff will not be able to assist you with questions regarding bills from these companies.  You will be contacted with the lab results as soon as they are available. The fastest way to get your results is to activate your My Chart account. Instructions are located on the last page of this paperwork. If you have not heard from [place] regarding the results in 2 weeks, please contact this office.

## 2016-11-28 ENCOUNTER — Encounter (HOSPITAL_COMMUNITY): Payer: Self-pay | Admitting: Emergency Medicine

## 2016-11-28 ENCOUNTER — Telehealth: Payer: Self-pay | Admitting: Family Medicine

## 2016-11-28 ENCOUNTER — Emergency Department (HOSPITAL_COMMUNITY)
Admission: EM | Admit: 2016-11-28 | Discharge: 2016-11-29 | Disposition: A | Discharge status: Home / Self Care | Payer: Self-pay | Attending: Emergency Medicine | Admitting: Emergency Medicine

## 2016-11-28 ENCOUNTER — Emergency Department (HOSPITAL_COMMUNITY): Payer: Self-pay

## 2016-11-28 DIAGNOSIS — Z859 Personal history of malignant neoplasm, unspecified: Secondary | ICD-10-CM | POA: Insufficient documentation

## 2016-11-28 DIAGNOSIS — Z79899 Other long term (current) drug therapy: Secondary | ICD-10-CM | POA: Insufficient documentation

## 2016-11-28 DIAGNOSIS — Y929 Unspecified place or not applicable: Secondary | ICD-10-CM | POA: Insufficient documentation

## 2016-11-28 DIAGNOSIS — F17210 Nicotine dependence, cigarettes, uncomplicated: Secondary | ICD-10-CM | POA: Insufficient documentation

## 2016-11-28 DIAGNOSIS — J45909 Unspecified asthma, uncomplicated: Secondary | ICD-10-CM | POA: Insufficient documentation

## 2016-11-28 DIAGNOSIS — Y999 Unspecified external cause status: Secondary | ICD-10-CM | POA: Insufficient documentation

## 2016-11-28 DIAGNOSIS — Y939 Activity, unspecified: Secondary | ICD-10-CM | POA: Insufficient documentation

## 2016-11-28 DIAGNOSIS — S161XXA Strain of muscle, fascia and tendon at neck level, initial encounter: Secondary | ICD-10-CM | POA: Insufficient documentation

## 2016-11-28 NOTE — Telephone Encounter (Signed)
See referral notes. The director of the clinic where Dr. Raeford Razor works refused the referral. I asked that she be referred to pain management instead. Another option is for her to schedule with Dr. Raeford Razor on a Monday morning when he will be in this office.

## 2016-11-28 NOTE — Telephone Encounter (Signed)
Pt calling stating the [facility] was going to refer her to Dr Raeford Razor about her back please respond

## 2016-11-28 NOTE — ED Notes (Signed)
Mortimer Fries - RN aware of pt's BP.
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
## 2016-11-29 MED ORDER — TRAMADOL HCL 50 MG PO TABS: 50.0000 mg | ORAL_TABLET | Freq: Four times a day (QID) | ORAL | 0 refills | Status: AC | PRN

## 2016-11-29 MED ORDER — KETOROLAC TROMETHAMINE 60 MG/2ML IM SOLN
60.0000 mg | Freq: Once | INTRAMUSCULAR | Status: AC
  Administered 2016-11-29: 60 mg via INTRAMUSCULAR
  Filled 2016-11-29: qty 2

## 2016-11-29 MED ORDER — IBUPROFEN 800 MG PO TABS: 800.0000 mg | ORAL_TABLET | Freq: Three times a day (TID) | ORAL | 0 refills | Status: AC | PRN

## 2016-11-29 MED ORDER — HYDROCODONE-ACETAMINOPHEN 5-325 MG PO TABS
1.0000 | ORAL_TABLET | Freq: Once | ORAL | Status: AC
  Administered 2016-11-29: 1 via ORAL
  Filled 2016-11-29: qty 1
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
## 2016-11-29 NOTE — Telephone Encounter (Signed)
Referral sent to [facility] and is under review to schedule.

## 2016-11-29 NOTE — Discharge Instructions (Signed)
Your x-rays were normal.  Return here as needed. Ice and heat on the areas that are sore.

## 2017-07-31 DEATH — deceased

## 2018-03-18 IMAGING — MR MR LUMBAR SPINE W/O CM
4 of 5 series · 19 of 48 positions shown · non-contrast
Comparison: MR lumbar spine 05/05/2016

CLINICAL DATA: Chronic back pain. Bilateral lower extremity
numbness and tingling.

EXAM:
MRI LUMBAR SPINE WITHOUT CONTRAST
TECHNIQUE: Multiplanar, multisequence MR imaging of the lumbar spine was
performed. No intravenous contrast was administered.

[Series 4: T1 · sagittal · 4.0mm · 0.55mm/px · 3 of 12 slices shown (1 of 2)]
[im 3/12]
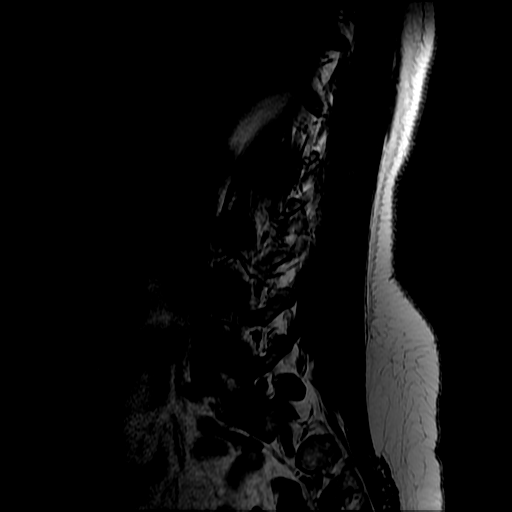
[im 7/12]
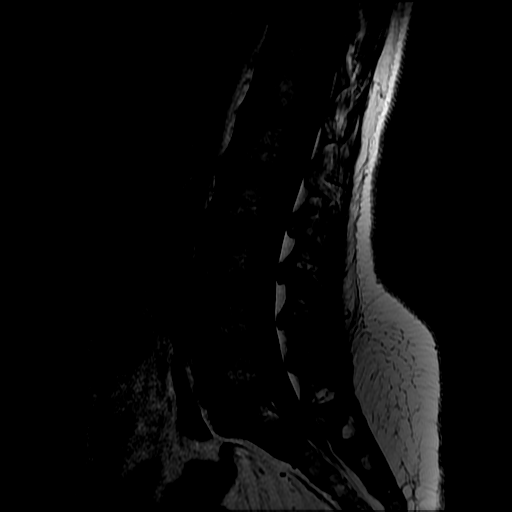
[im 12/12]
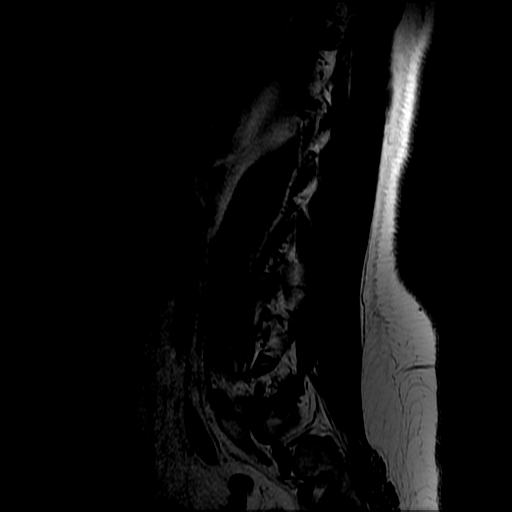

[Series 6: T2 · axial · 4.0mm · 0.39mm/px · z∈[-89,+81]mm · 9 of 29 slices shown (1 of 2)]
[im 1/29]
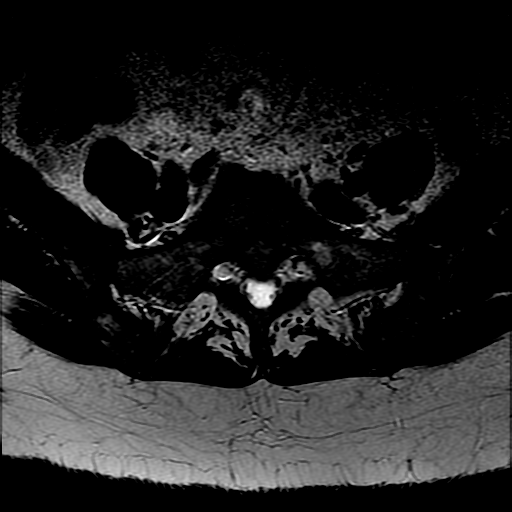
[im 5/29]
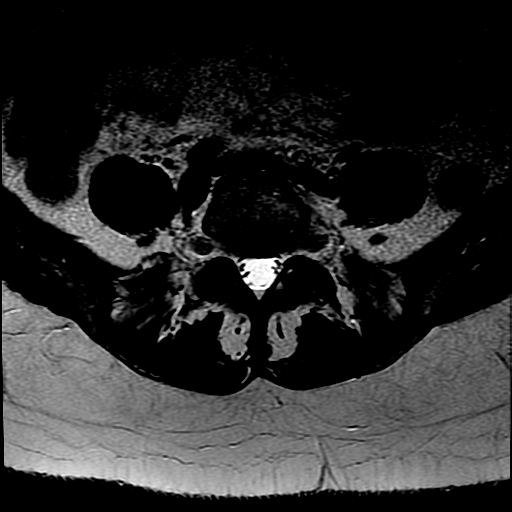
[im 9/29]
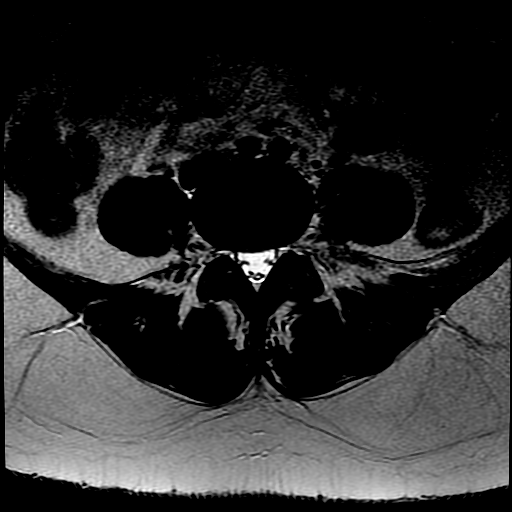
[im 13/29]
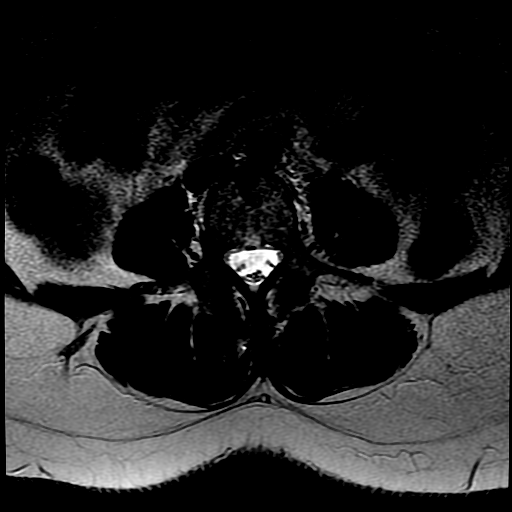
[im 15/29]
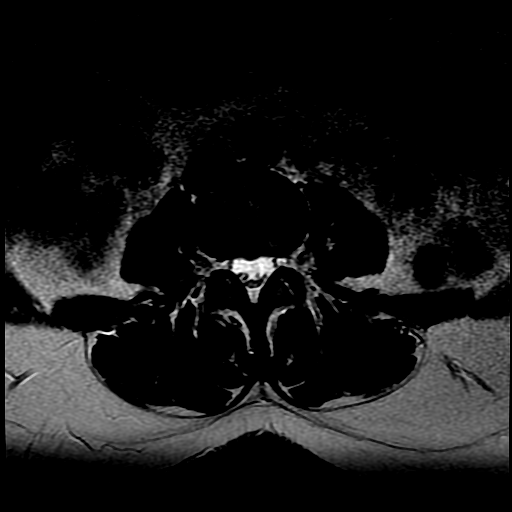
[im 17/29]
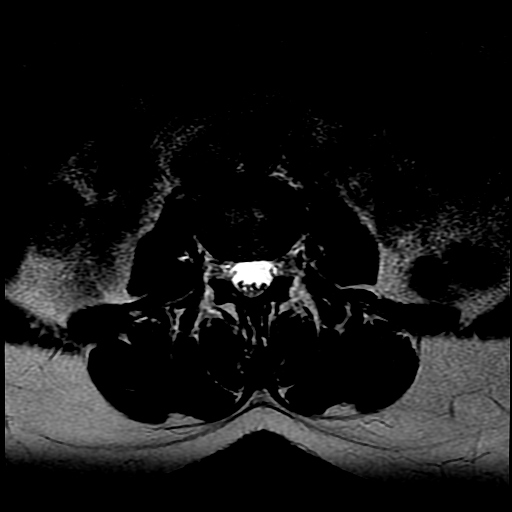
[im 21/29]
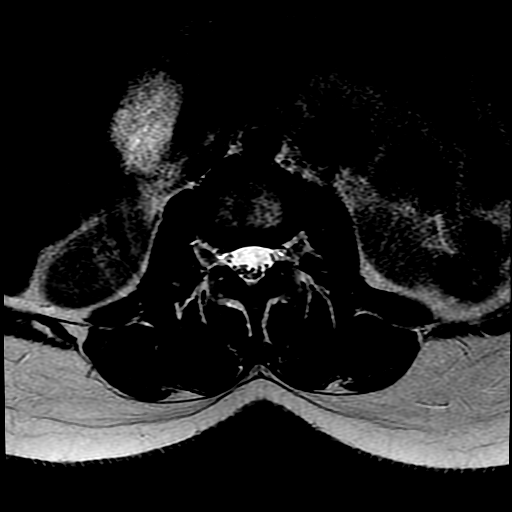
[im 25/29]
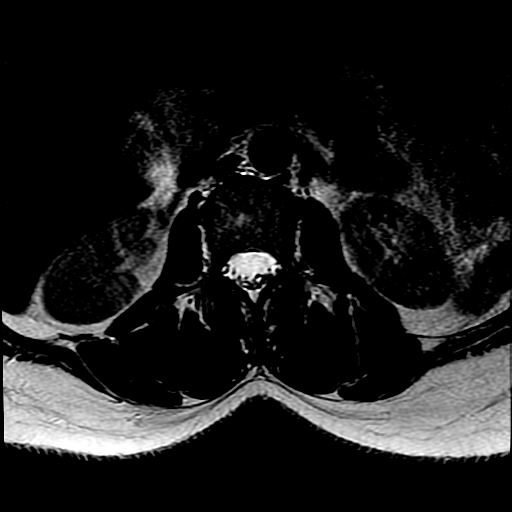
[im 29/29]
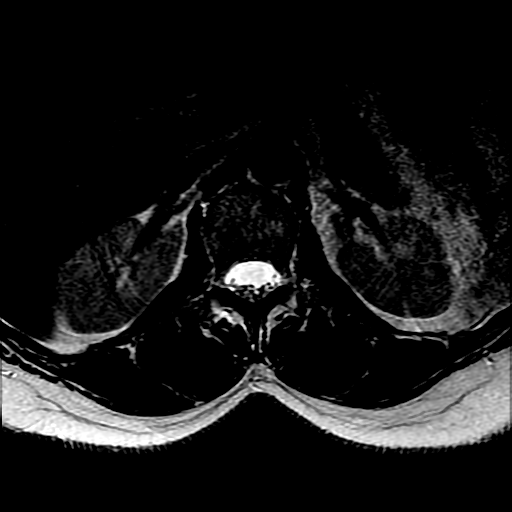

[Series 7: T1 · axial · 4.0mm · 0.39mm/px · z∈[-70,+61]mm · 3 of 29 slices shown (2 of 2)]
[im 5/29]
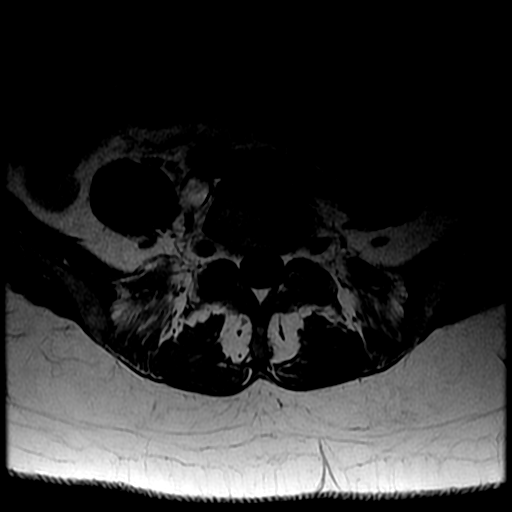
[im 15/29]
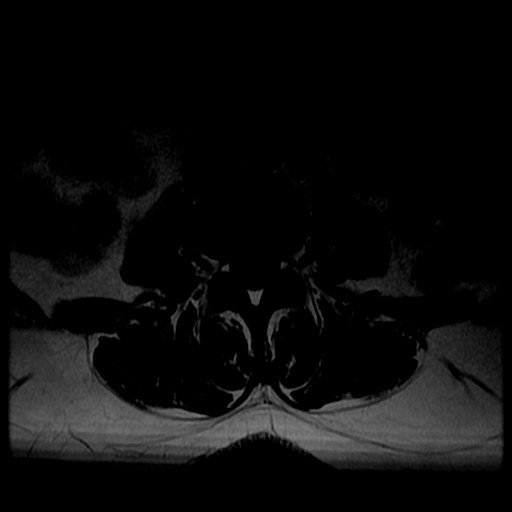
[im 25/29]
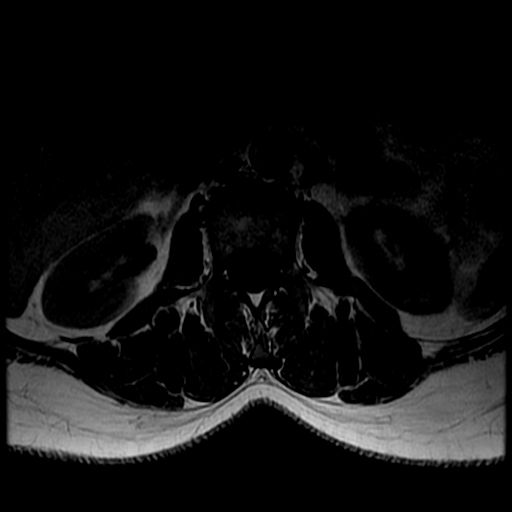

[Series 8: T2 · sagittal · 4.0mm · 0.55mm/px · 4 of 12 slices shown (2 of 2)]
[im 1/12]
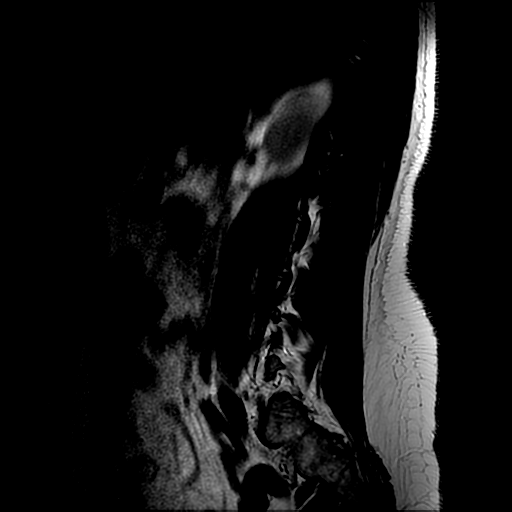
[im 3/12]
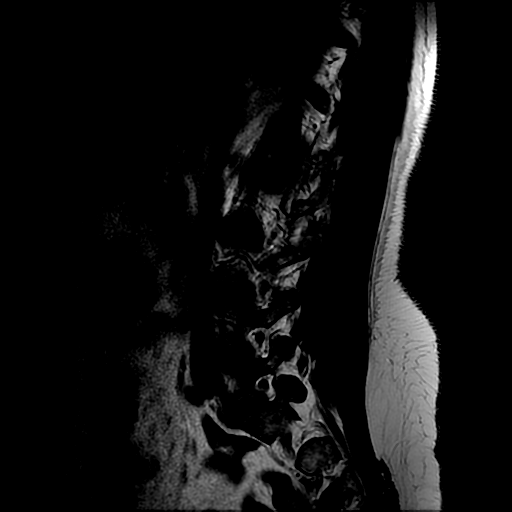
[im 7/12]
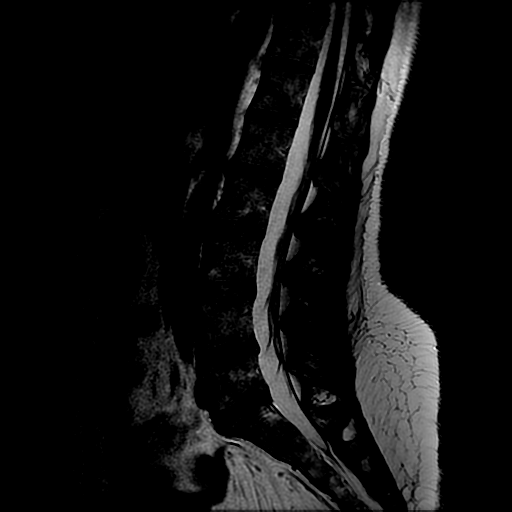
[im 12/12]
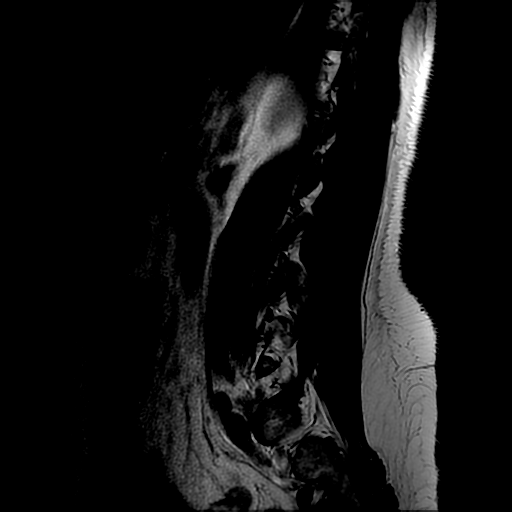

[19 of 48 positions shown; findings below may reference images not displayed]

FINDINGS: Segmentation:  Normal

Alignment:  Normal

Vertebrae: No acute compression fracture, discitis-osteomyelitis,
facet edema or other focal marrow lesion. No epidural collection.

Conus medullaris: Extends to the L1 level and appears normal.

Paraspinal and other soft tissues: The visualized aorta, IVC and
iliac vessels are normal. The visualized retroperitoneal organs and
paraspinal soft tissues are normal.

Disc levels:

T12-L1: Normal disc space and facets. No spinal canal or
neuroforaminal stenosis.

L1-L2: Normal disc space and facets. No spinal canal or
neuroforaminal stenosis.

L2-L3: Normal disc space and facets. No spinal canal or
neuroforaminal stenosis.

L3-L4: Minimal disc bulge without spinal canal or neural foraminal
stenosis.

L4-L5: Unchanged small central disc protrusion with annular fissure.
No spinal canal or neural foraminal stenosis.

L5-S1: Small right subarticular protrusion, mildly narrowing the
right lateral recess. No spinal canal or neural foraminal stenosis.
IMPRESSION: 1. Slight worsening of right subarticular disc protrusion and
annular fissure at L5-S1, mildly narrowing the right lateral recess.
Correlate for right S1 radiculopathy.
2. Unchanged small central disc protrusion with annular fissure at
L4-L5 without spinal canal or neural foraminal stenosis.
3. No compression of the cauda equina. No spinal canal stenosis or
foraminal nerve root impingement.
